# Patient Record
Sex: Female | Born: 1945 | Race: White | Hispanic: No | Smoking: Never smoker
Health system: Southern US, Community
[De-identification: ages and names within clinical notes are randomized; demographics above are authoritative.]

## PROBLEM LIST (undated history)

## (undated) DIAGNOSIS — C4491 Basal cell carcinoma of skin, unspecified: Secondary | ICD-10-CM

## (undated) DIAGNOSIS — N183 Chronic kidney disease, stage 3 unspecified: Secondary | ICD-10-CM

## (undated) DIAGNOSIS — T8859XA Other complications of anesthesia, initial encounter: Secondary | ICD-10-CM

## (undated) DIAGNOSIS — E785 Hyperlipidemia, unspecified: Secondary | ICD-10-CM

## (undated) DIAGNOSIS — S82899A Other fracture of unspecified lower leg, initial encounter for closed fracture: Secondary | ICD-10-CM

## (undated) DIAGNOSIS — T148XXA Other injury of unspecified body region, initial encounter: Secondary | ICD-10-CM

## (undated) DIAGNOSIS — I10 Essential (primary) hypertension: Secondary | ICD-10-CM

## (undated) DIAGNOSIS — R112 Nausea with vomiting, unspecified: Secondary | ICD-10-CM

## (undated) DIAGNOSIS — R011 Cardiac murmur, unspecified: Secondary | ICD-10-CM

## (undated) DIAGNOSIS — C4492 Squamous cell carcinoma of skin, unspecified: Secondary | ICD-10-CM

## (undated) HISTORY — PX: CATARACT EXTRACTION, BILATERAL: SHX1313

## (undated) HISTORY — PX: COLONOSCOPY: SHX174

## (undated) HISTORY — PX: COLON SURGERY: SHX602

## (undated) HISTORY — PX: ABDOMINAL HYSTERECTOMY: SHX81

---

## 2004-09-17 ENCOUNTER — Emergency Department (HOSPITAL_COMMUNITY): Admission: EM | Admit: 2004-09-17 | Discharge: 2004-09-17 | Payer: Self-pay | Admitting: Emergency Medicine

## 2004-09-22 ENCOUNTER — Ambulatory Visit: Payer: Self-pay | Admitting: Orthopedic Surgery

## 2006-09-12 IMAGING — CR DG KNEE COMPLETE 4+V*R*
4 series · 4 of 4 positions shown · non-contrast
Comparison: none

CLINICAL DATA: Fall with right ankle and right knee injuries.
 RIGHT ANKLE ? 3 VIEWS:
 Oblique and mildly displaced fracture is noted through the distal fibula extending into the joint space.  There is associated overlying soft tissue swelling.  No other fracture is seen.  There is associated joint effusion.  Alignment of the ankle mortis itself is intact.

[view not recorded (1 of 4)]
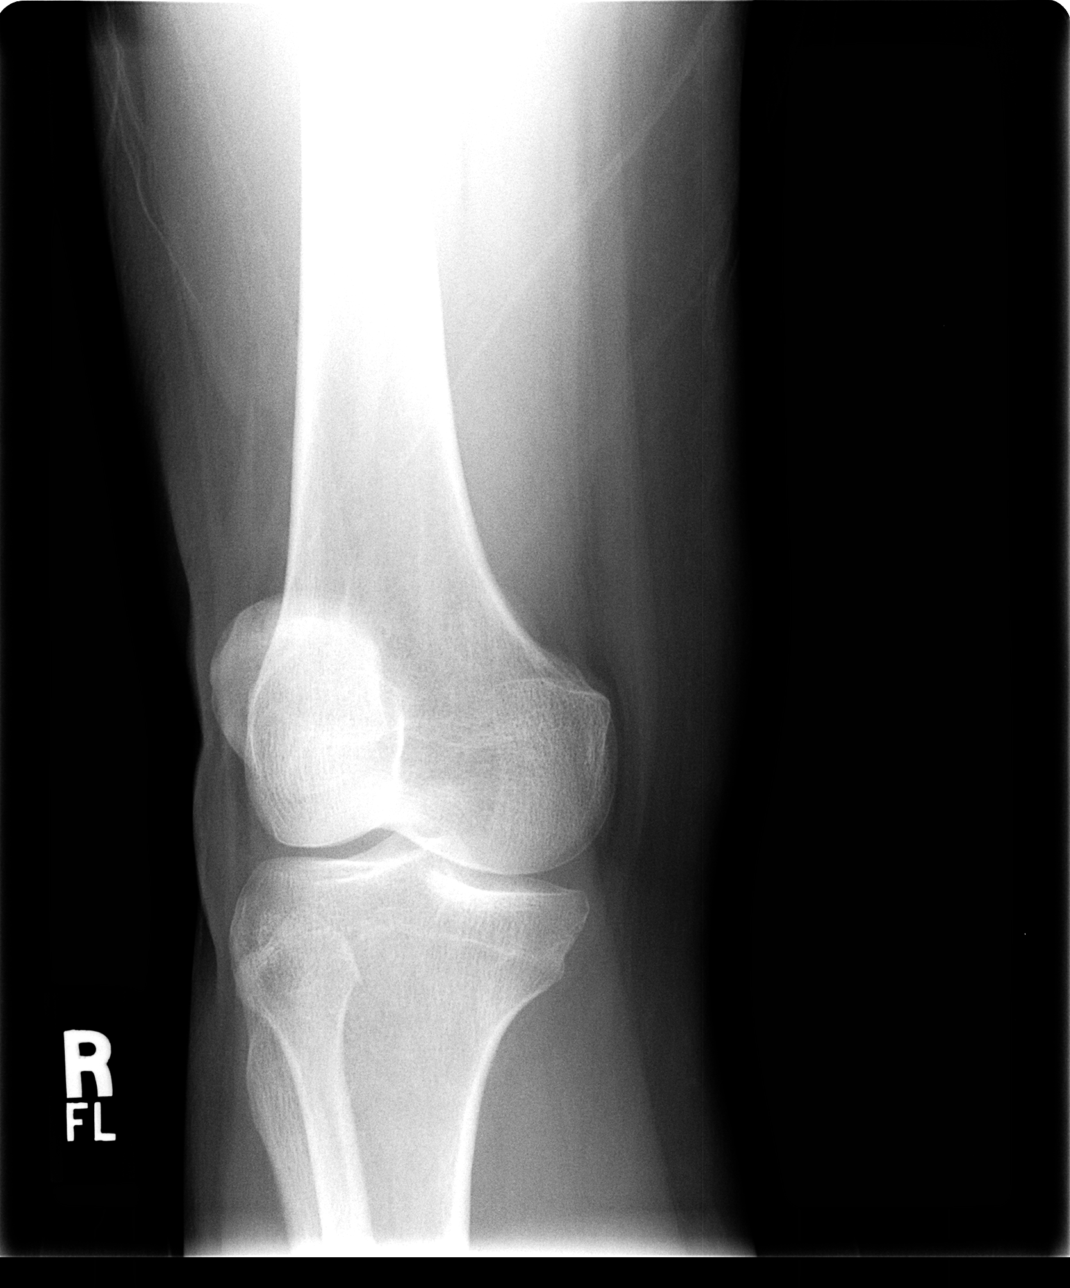

[view not recorded (2 of 4)]
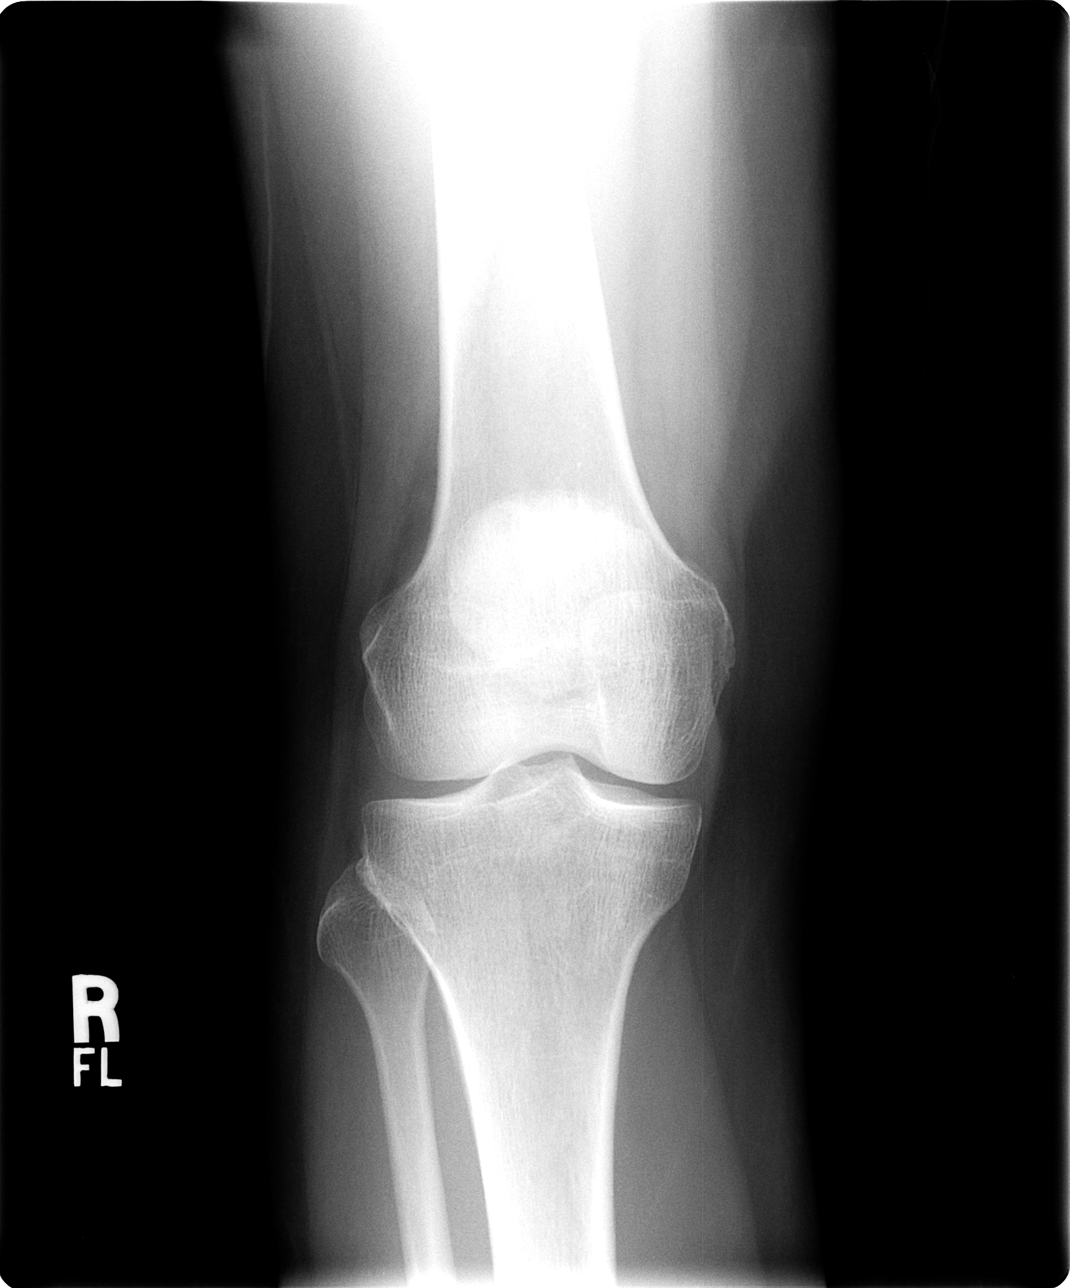

[view not recorded (3 of 4)]
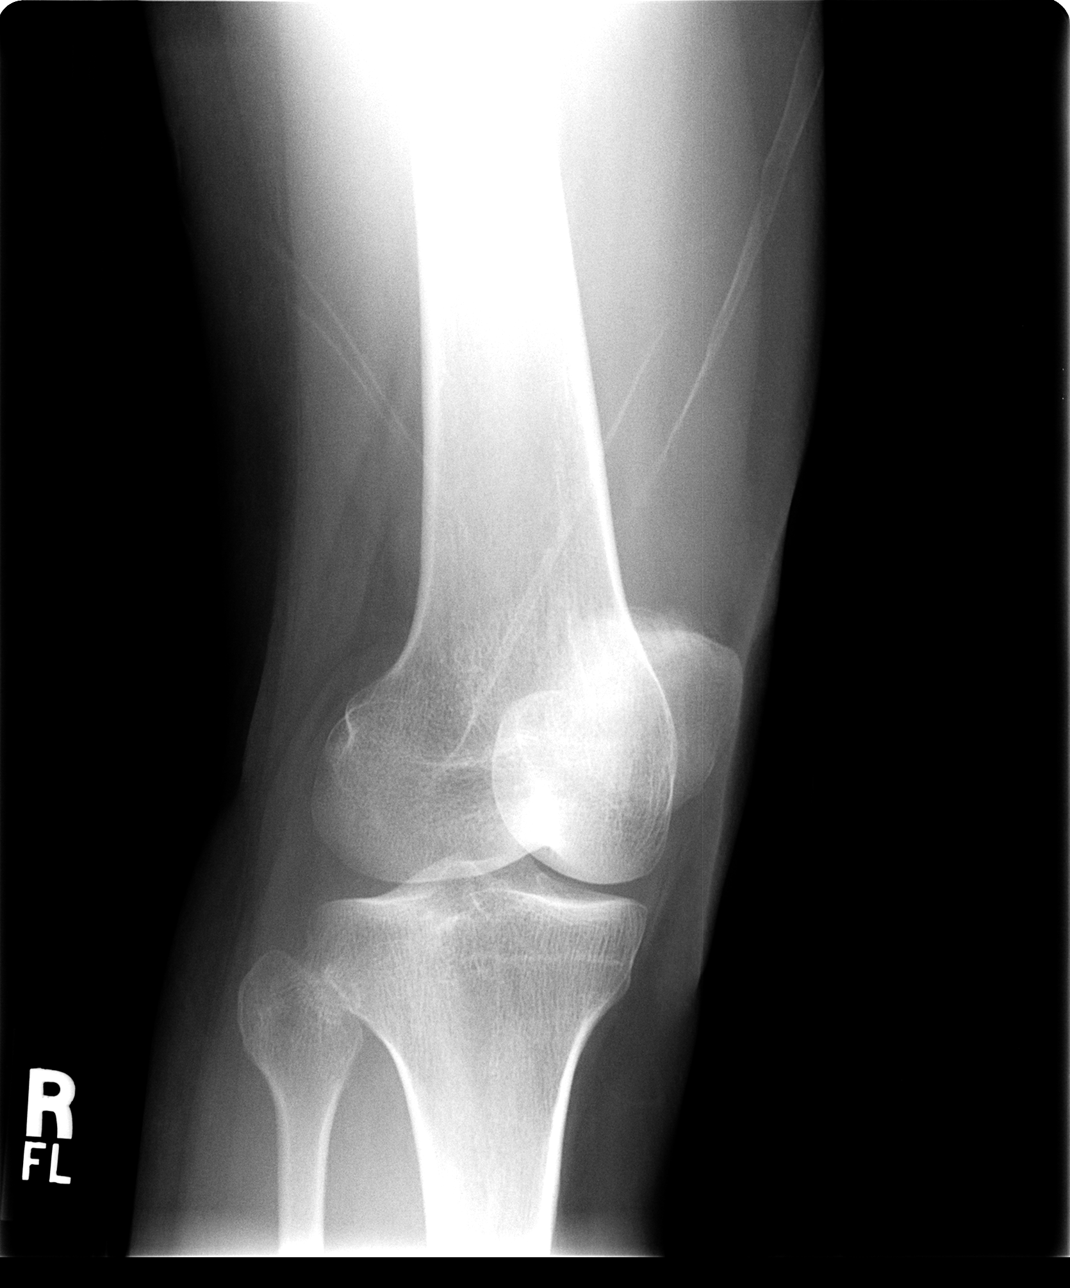

[view not recorded (4 of 4)]
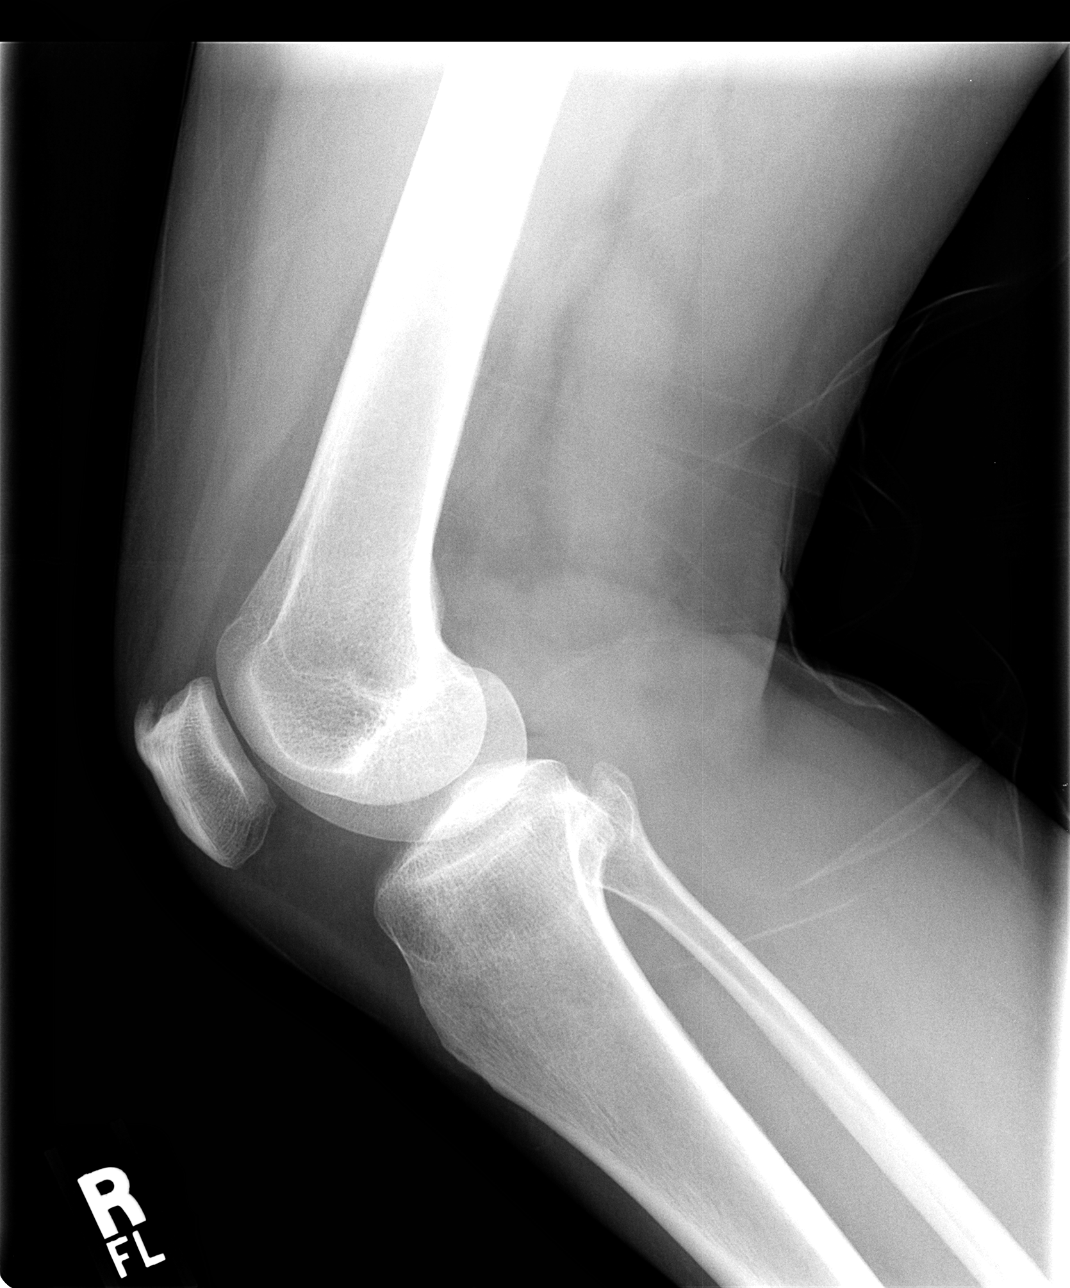

[4 of 4 positions shown; findings below may reference images not displayed]

IMPRESSION: Oblique fracture of lateral malleolus.
 RIGHT KNEE ? 4 VIEWS;
 No acute fracture or dislocation.  No joint effusion on the lateral film.  Superior patellar spur.
IMPRESSION: No acute abnormalities.

## 2006-09-12 IMAGING — CR DG ANKLE COMPLETE 3+V*R*
2 series · 2 of 2 positions shown · non-contrast
Comparison: none

CLINICAL DATA: Fall with right ankle and right knee injuries.
 RIGHT ANKLE ? 3 VIEWS:
 Oblique and mildly displaced fracture is noted through the distal fibula extending into the joint space.  There is associated overlying soft tissue swelling.  No other fracture is seen.  There is associated joint effusion.  Alignment of the ankle mortis itself is intact.

[view not recorded (1 of 2)]
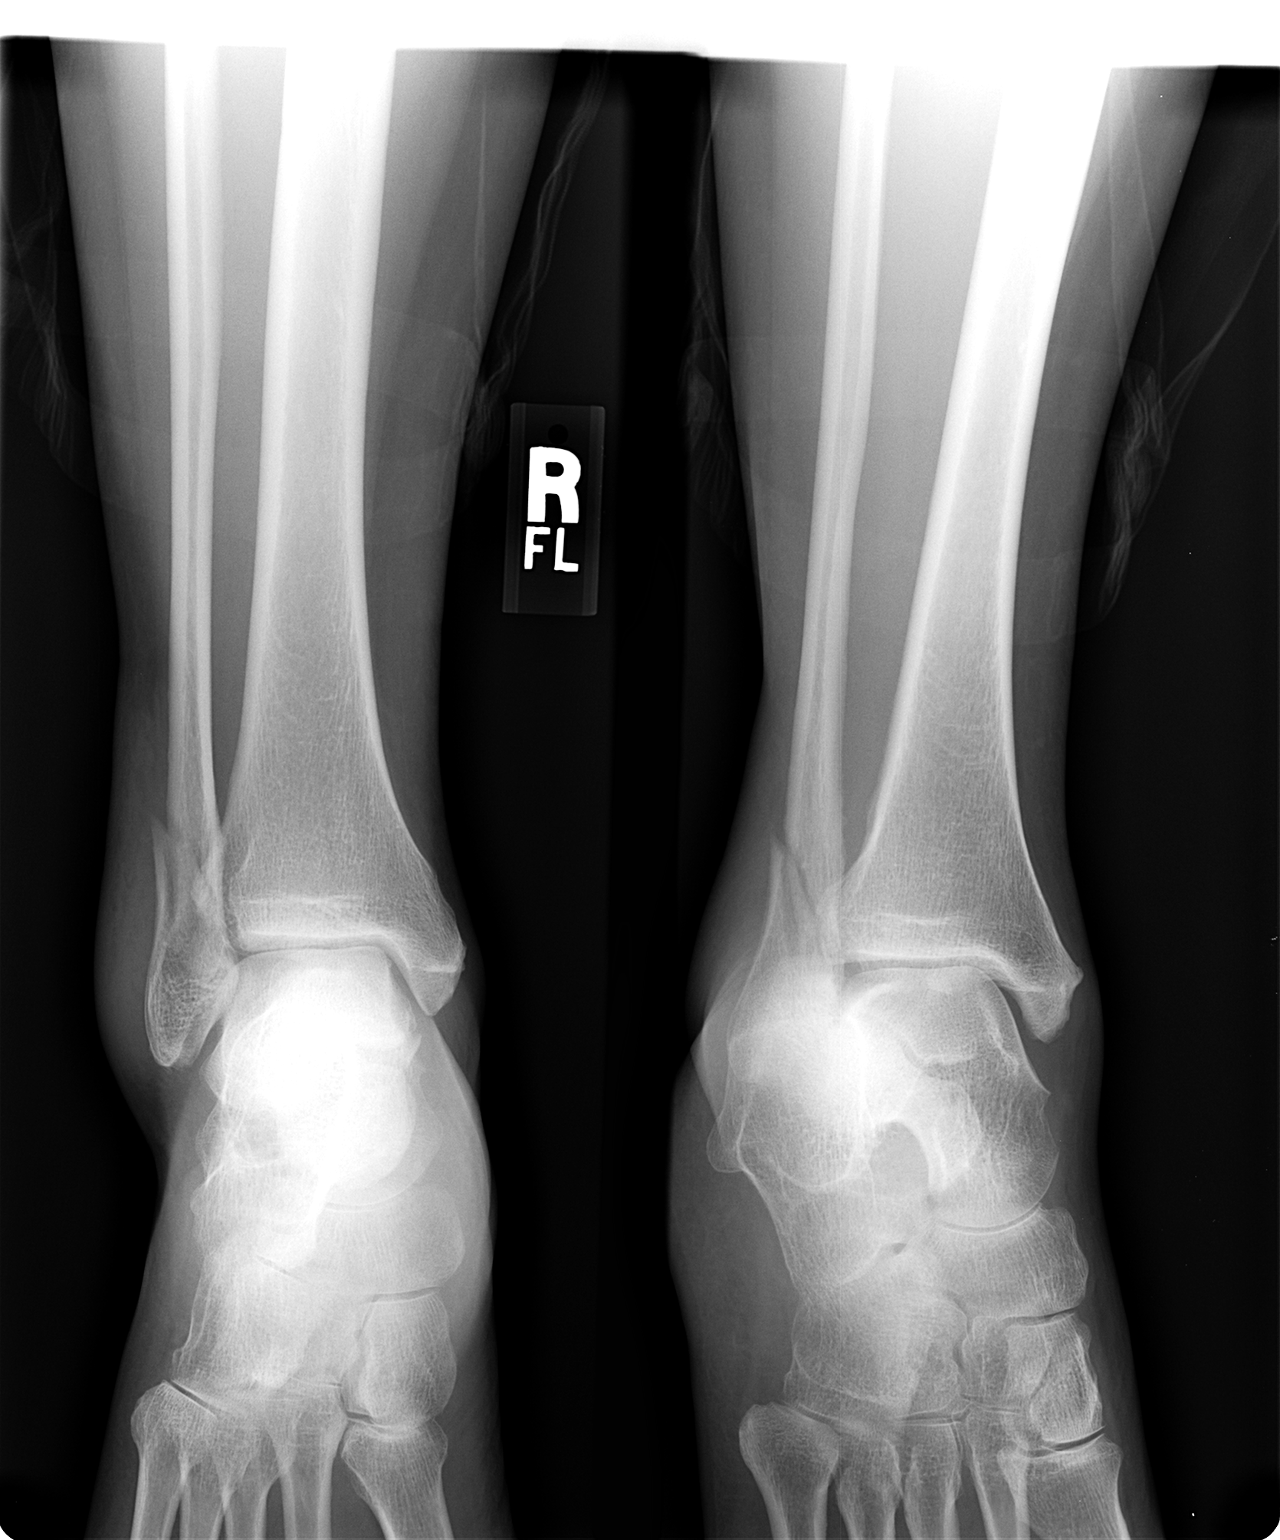

[view not recorded (2 of 2)]
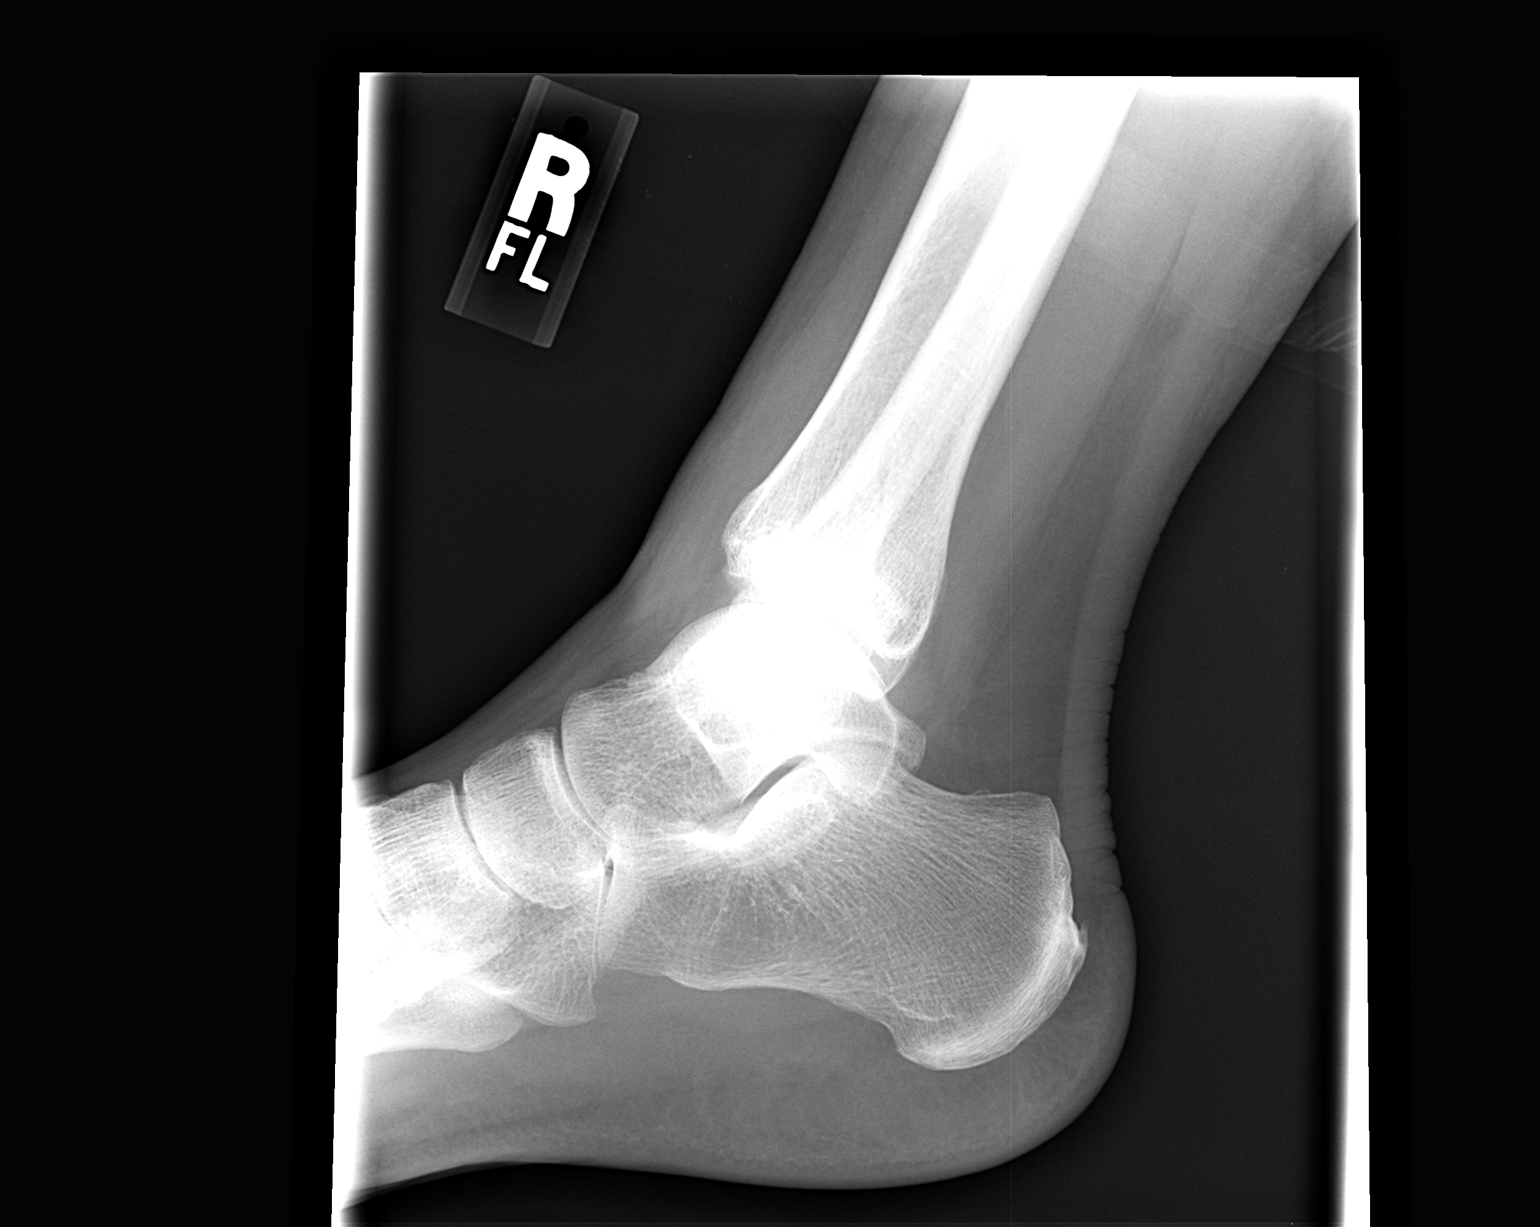

[2 of 2 positions shown; findings below may reference images not displayed]

IMPRESSION: Oblique fracture of lateral malleolus.
 RIGHT KNEE ? 4 VIEWS;
 No acute fracture or dislocation.  No joint effusion on the lateral film.  Superior patellar spur.
IMPRESSION: No acute abnormalities.

## 2015-06-28 ENCOUNTER — Ambulatory Visit (HOSPITAL_COMMUNITY)
Admission: RE | Admit: 2015-06-28 | Discharge: 2015-06-28 | Disposition: A | Discharge status: Home / Self Care | Payer: Medicare Other | Source: Ambulatory Visit | Attending: Ophthalmology | Admitting: Ophthalmology

## 2015-06-28 ENCOUNTER — Encounter (HOSPITAL_COMMUNITY)
Admission: RE | Disposition: A | Discharge status: Home / Self Care | Payer: Self-pay | Source: Ambulatory Visit | Attending: Ophthalmology

## 2015-06-28 ENCOUNTER — Encounter (HOSPITAL_COMMUNITY): Payer: Self-pay

## 2015-06-28 DIAGNOSIS — H2640 Unspecified secondary cataract: Secondary | ICD-10-CM | POA: Diagnosis present

## 2015-06-28 DIAGNOSIS — Z7982 Long term (current) use of aspirin: Secondary | ICD-10-CM | POA: Insufficient documentation

## 2015-06-28 HISTORY — DX: Essential (primary) hypertension: I10

## 2015-06-28 HISTORY — PX: YAG LASER APPLICATION: SHX6189

## 2015-06-28 HISTORY — DX: Hyperlipidemia, unspecified: E78.5

## 2015-06-28 SURGERY — TREATMENT, USING YAG LASER
Anesthesia: LOCAL | Laterality: Right

## 2015-06-28 MED ORDER — TETRACAINE HCL 0.5 % OP SOLN
1.0000 [drp] | Freq: Once | OPHTHALMIC | Status: AC
  Administered 2015-06-28: 1 [drp] via OPHTHALMIC

## 2015-06-28 MED ORDER — TETRACAINE HCL 0.5 % OP SOLN
OPHTHALMIC | Status: AC
  Filled 2015-06-28: qty 4

## 2015-06-28 MED ORDER — TROPICAMIDE 1 % OP SOLN
OPHTHALMIC | Status: AC
  Filled 2015-06-28: qty 3

## 2015-06-28 MED ORDER — TROPICAMIDE 1 % OP SOLN
1.0000 [drp] | Freq: Once | OPHTHALMIC | Status: AC
  Administered 2015-06-28: 1 [drp] via OPHTHALMIC

## 2015-06-28 NOTE — Discharge Instructions (Addendum)
Brenda Dorsey  06/28/2015     Instructions    Activity: No Restrictions.   Diet: Resume Diet you were on at home.   Pain Medication: Tylenol if Needed.   CONTACT YOUR DOCTOR IF YOU HAVE PAIN, REDNESS IN YOUR EYE, OR DECREASED VISION.   Follow-up:in 3 weeks with Susa Simmonds, MD.   Dr. Nile Riggs: [phone]  Dr. Lita Mains: [phone]  Dr. Alto Denver: [phone]   If you find that you cannot contact your physician, but feel that your signs and   Symptoms warrant a physician's attention, call the Emergency Room at   [phone] ext.[phone].   Othern/a.   Follow up appointment with Dr. Lita Mains at his office on 07/20/2015 @ 11:15 am

## 2015-06-28 NOTE — Brief Op Note (Signed)
Brenda Dorsey 06/28/2015  Susa Simmonds, MD  Pre-op Diagnosis:  secondary cataract right eye  Post-op Diagnosis:  same  Yag laser self-test completed: Yes.    Indications:  See scanned office H&P  Procedure:  YAG posterior capsulotomy OS  Eye protection worn by staff:  Yes.   Laser In Use sign on door:  Yes.    Laser:  {LUMENIS YAG/SLT LASER  selecta duet  Power Setting:  1.37 mJ/burst Anatomical site treated:  Posterior capsule OS Number of applications:  39 Total energy delivered: 64.89 mJ Results:  Significant capsular scarring successfully opened with resulting clear visual axis  Patient was instructed to go to the office, as previously scheduled, for intraocular pressure:  No.  Patient verbalizes understanding of discharge instructions:  Yes.    Notes:  Pt. Tolerated procedure well without complications

## 2015-06-30 ENCOUNTER — Encounter (HOSPITAL_COMMUNITY): Payer: Self-pay | Admitting: Ophthalmology

## 2019-05-13 DIAGNOSIS — I10 Essential (primary) hypertension: Secondary | ICD-10-CM | POA: Diagnosis not present

## 2019-05-13 DIAGNOSIS — F329 Major depressive disorder, single episode, unspecified: Secondary | ICD-10-CM | POA: Diagnosis not present

## 2019-06-11 DIAGNOSIS — F329 Major depressive disorder, single episode, unspecified: Secondary | ICD-10-CM | POA: Diagnosis not present

## 2019-06-11 DIAGNOSIS — I10 Essential (primary) hypertension: Secondary | ICD-10-CM | POA: Diagnosis not present

## 2019-07-08 DIAGNOSIS — F329 Major depressive disorder, single episode, unspecified: Secondary | ICD-10-CM | POA: Diagnosis not present

## 2019-07-08 DIAGNOSIS — I10 Essential (primary) hypertension: Secondary | ICD-10-CM | POA: Diagnosis not present

## 2019-08-20 DIAGNOSIS — I10 Essential (primary) hypertension: Secondary | ICD-10-CM | POA: Diagnosis not present

## 2019-08-20 DIAGNOSIS — F329 Major depressive disorder, single episode, unspecified: Secondary | ICD-10-CM | POA: Diagnosis not present

## 2019-08-28 DIAGNOSIS — H26492 Other secondary cataract, left eye: Secondary | ICD-10-CM | POA: Diagnosis not present

## 2019-08-28 DIAGNOSIS — Z961 Presence of intraocular lens: Secondary | ICD-10-CM | POA: Diagnosis not present

## 2019-09-23 DIAGNOSIS — Z85828 Personal history of other malignant neoplasm of skin: Secondary | ICD-10-CM | POA: Diagnosis not present

## 2019-09-23 DIAGNOSIS — L570 Actinic keratosis: Secondary | ICD-10-CM | POA: Diagnosis not present

## 2019-09-23 DIAGNOSIS — L02214 Cutaneous abscess of groin: Secondary | ICD-10-CM | POA: Diagnosis not present

## 2019-10-07 DIAGNOSIS — L02214 Cutaneous abscess of groin: Secondary | ICD-10-CM | POA: Diagnosis not present

## 2019-10-16 DIAGNOSIS — T1501XD Foreign body in cornea, right eye, subsequent encounter: Secondary | ICD-10-CM | POA: Diagnosis not present

## 2019-10-21 DIAGNOSIS — Z1339 Encounter for screening examination for other mental health and behavioral disorders: Secondary | ICD-10-CM | POA: Diagnosis not present

## 2019-10-21 DIAGNOSIS — Z6823 Body mass index (BMI) 23.0-23.9, adult: Secondary | ICD-10-CM | POA: Diagnosis not present

## 2019-10-21 DIAGNOSIS — E7800 Pure hypercholesterolemia, unspecified: Secondary | ICD-10-CM | POA: Diagnosis not present

## 2019-10-21 DIAGNOSIS — Z0000 Encounter for general adult medical examination without abnormal findings: Secondary | ICD-10-CM | POA: Diagnosis not present

## 2019-10-21 DIAGNOSIS — Z7189 Other specified counseling: Secondary | ICD-10-CM | POA: Diagnosis not present

## 2019-10-21 DIAGNOSIS — Z1211 Encounter for screening for malignant neoplasm of colon: Secondary | ICD-10-CM | POA: Diagnosis not present

## 2019-10-21 DIAGNOSIS — E559 Vitamin D deficiency, unspecified: Secondary | ICD-10-CM | POA: Diagnosis not present

## 2019-10-21 DIAGNOSIS — Z79899 Other long term (current) drug therapy: Secondary | ICD-10-CM | POA: Diagnosis not present

## 2019-10-21 DIAGNOSIS — R5383 Other fatigue: Secondary | ICD-10-CM | POA: Diagnosis not present

## 2019-10-21 DIAGNOSIS — Z299 Encounter for prophylactic measures, unspecified: Secondary | ICD-10-CM | POA: Diagnosis not present

## 2019-10-21 DIAGNOSIS — Z1331 Encounter for screening for depression: Secondary | ICD-10-CM | POA: Diagnosis not present

## 2019-11-17 DIAGNOSIS — E2839 Other primary ovarian failure: Secondary | ICD-10-CM | POA: Diagnosis not present

## 2020-04-19 DIAGNOSIS — N183 Chronic kidney disease, stage 3 unspecified: Secondary | ICD-10-CM | POA: Diagnosis not present

## 2020-04-19 DIAGNOSIS — I10 Essential (primary) hypertension: Secondary | ICD-10-CM | POA: Diagnosis not present

## 2020-04-19 DIAGNOSIS — Z299 Encounter for prophylactic measures, unspecified: Secondary | ICD-10-CM | POA: Diagnosis not present

## 2020-06-21 DIAGNOSIS — H1131 Conjunctival hemorrhage, right eye: Secondary | ICD-10-CM | POA: Diagnosis not present

## 2020-06-21 DIAGNOSIS — Z961 Presence of intraocular lens: Secondary | ICD-10-CM | POA: Diagnosis not present

## 2020-06-21 DIAGNOSIS — Z0100 Encounter for examination of eyes and vision without abnormal findings: Secondary | ICD-10-CM | POA: Diagnosis not present

## 2020-06-21 DIAGNOSIS — H524 Presbyopia: Secondary | ICD-10-CM | POA: Diagnosis not present

## 2020-10-21 DIAGNOSIS — Z1331 Encounter for screening for depression: Secondary | ICD-10-CM | POA: Diagnosis not present

## 2020-10-21 DIAGNOSIS — E559 Vitamin D deficiency, unspecified: Secondary | ICD-10-CM | POA: Diagnosis not present

## 2020-10-21 DIAGNOSIS — I10 Essential (primary) hypertension: Secondary | ICD-10-CM | POA: Diagnosis not present

## 2020-10-21 DIAGNOSIS — N183 Chronic kidney disease, stage 3 unspecified: Secondary | ICD-10-CM | POA: Diagnosis not present

## 2020-10-21 DIAGNOSIS — R5383 Other fatigue: Secondary | ICD-10-CM | POA: Diagnosis not present

## 2020-10-21 DIAGNOSIS — Z1339 Encounter for screening examination for other mental health and behavioral disorders: Secondary | ICD-10-CM | POA: Diagnosis not present

## 2020-10-21 DIAGNOSIS — Z7189 Other specified counseling: Secondary | ICD-10-CM | POA: Diagnosis not present

## 2020-10-21 DIAGNOSIS — E7800 Pure hypercholesterolemia, unspecified: Secondary | ICD-10-CM | POA: Diagnosis not present

## 2020-10-21 DIAGNOSIS — Z79899 Other long term (current) drug therapy: Secondary | ICD-10-CM | POA: Diagnosis not present

## 2020-10-21 DIAGNOSIS — Z6823 Body mass index (BMI) 23.0-23.9, adult: Secondary | ICD-10-CM | POA: Diagnosis not present

## 2020-10-21 DIAGNOSIS — Z299 Encounter for prophylactic measures, unspecified: Secondary | ICD-10-CM | POA: Diagnosis not present

## 2020-10-21 DIAGNOSIS — Z0000 Encounter for general adult medical examination without abnormal findings: Secondary | ICD-10-CM | POA: Diagnosis not present

## 2020-11-04 ENCOUNTER — Other Ambulatory Visit: Payer: Self-pay | Admitting: Internal Medicine

## 2020-11-04 DIAGNOSIS — Z139 Encounter for screening, unspecified: Secondary | ICD-10-CM

## 2020-11-05 ENCOUNTER — Ambulatory Visit
Admission: RE | Admit: 2020-11-05 | Discharge: 2020-11-05 | Disposition: A | Discharge status: Home / Self Care | Payer: Medicare HMO | Source: Ambulatory Visit | Attending: Internal Medicine | Admitting: Internal Medicine

## 2020-11-05 ENCOUNTER — Other Ambulatory Visit: Payer: Self-pay

## 2020-11-05 DIAGNOSIS — Z139 Encounter for screening, unspecified: Secondary | ICD-10-CM

## 2020-11-05 DIAGNOSIS — Z1231 Encounter for screening mammogram for malignant neoplasm of breast: Secondary | ICD-10-CM | POA: Diagnosis not present

## 2021-04-22 DIAGNOSIS — H6123 Impacted cerumen, bilateral: Secondary | ICD-10-CM | POA: Diagnosis not present

## 2021-04-22 DIAGNOSIS — F32A Depression, unspecified: Secondary | ICD-10-CM | POA: Diagnosis not present

## 2021-04-22 DIAGNOSIS — I10 Essential (primary) hypertension: Secondary | ICD-10-CM | POA: Diagnosis not present

## 2021-04-22 DIAGNOSIS — Z299 Encounter for prophylactic measures, unspecified: Secondary | ICD-10-CM | POA: Diagnosis not present

## 2021-04-22 DIAGNOSIS — Z789 Other specified health status: Secondary | ICD-10-CM | POA: Diagnosis not present

## 2021-07-20 DIAGNOSIS — E7800 Pure hypercholesterolemia, unspecified: Secondary | ICD-10-CM | POA: Diagnosis not present

## 2021-07-20 DIAGNOSIS — I10 Essential (primary) hypertension: Secondary | ICD-10-CM | POA: Diagnosis not present

## 2021-07-20 DIAGNOSIS — H524 Presbyopia: Secondary | ICD-10-CM | POA: Diagnosis not present

## 2021-07-20 DIAGNOSIS — Z0100 Encounter for examination of eyes and vision without abnormal findings: Secondary | ICD-10-CM | POA: Diagnosis not present

## 2021-10-20 DIAGNOSIS — Z299 Encounter for prophylactic measures, unspecified: Secondary | ICD-10-CM | POA: Diagnosis not present

## 2021-10-20 DIAGNOSIS — Z6824 Body mass index (BMI) 24.0-24.9, adult: Secondary | ICD-10-CM | POA: Diagnosis not present

## 2021-10-20 DIAGNOSIS — R5383 Other fatigue: Secondary | ICD-10-CM | POA: Diagnosis not present

## 2021-10-20 DIAGNOSIS — E559 Vitamin D deficiency, unspecified: Secondary | ICD-10-CM | POA: Diagnosis not present

## 2021-10-20 DIAGNOSIS — Z1331 Encounter for screening for depression: Secondary | ICD-10-CM | POA: Diagnosis not present

## 2021-10-20 DIAGNOSIS — Z1339 Encounter for screening examination for other mental health and behavioral disorders: Secondary | ICD-10-CM | POA: Diagnosis not present

## 2021-10-20 DIAGNOSIS — E7800 Pure hypercholesterolemia, unspecified: Secondary | ICD-10-CM | POA: Diagnosis not present

## 2021-10-20 DIAGNOSIS — Z0000 Encounter for general adult medical examination without abnormal findings: Secondary | ICD-10-CM | POA: Diagnosis not present

## 2021-10-20 DIAGNOSIS — I10 Essential (primary) hypertension: Secondary | ICD-10-CM | POA: Diagnosis not present

## 2021-10-20 DIAGNOSIS — Z79899 Other long term (current) drug therapy: Secondary | ICD-10-CM | POA: Diagnosis not present

## 2021-10-20 DIAGNOSIS — Z7189 Other specified counseling: Secondary | ICD-10-CM | POA: Diagnosis not present

## 2021-10-28 DIAGNOSIS — Z299 Encounter for prophylactic measures, unspecified: Secondary | ICD-10-CM | POA: Diagnosis not present

## 2021-10-28 DIAGNOSIS — H6122 Impacted cerumen, left ear: Secondary | ICD-10-CM | POA: Diagnosis not present

## 2021-10-28 DIAGNOSIS — I10 Essential (primary) hypertension: Secondary | ICD-10-CM | POA: Diagnosis not present

## 2021-11-21 DIAGNOSIS — E2839 Other primary ovarian failure: Secondary | ICD-10-CM | POA: Diagnosis not present

## 2022-02-20 DIAGNOSIS — Z23 Encounter for immunization: Secondary | ICD-10-CM | POA: Diagnosis not present

## 2022-02-20 DIAGNOSIS — Z0000 Encounter for general adult medical examination without abnormal findings: Secondary | ICD-10-CM | POA: Diagnosis not present

## 2022-02-20 DIAGNOSIS — I10 Essential (primary) hypertension: Secondary | ICD-10-CM | POA: Diagnosis not present

## 2022-02-20 DIAGNOSIS — Z299 Encounter for prophylactic measures, unspecified: Secondary | ICD-10-CM | POA: Diagnosis not present

## 2022-02-20 DIAGNOSIS — F32A Depression, unspecified: Secondary | ICD-10-CM | POA: Diagnosis not present

## 2022-02-20 DIAGNOSIS — R21 Rash and other nonspecific skin eruption: Secondary | ICD-10-CM | POA: Diagnosis not present

## 2022-02-20 DIAGNOSIS — Z6824 Body mass index (BMI) 24.0-24.9, adult: Secondary | ICD-10-CM | POA: Diagnosis not present

## 2022-05-31 DIAGNOSIS — Z299 Encounter for prophylactic measures, unspecified: Secondary | ICD-10-CM | POA: Diagnosis not present

## 2022-05-31 DIAGNOSIS — I10 Essential (primary) hypertension: Secondary | ICD-10-CM | POA: Diagnosis not present

## 2022-05-31 DIAGNOSIS — N183 Chronic kidney disease, stage 3 unspecified: Secondary | ICD-10-CM | POA: Diagnosis not present

## 2022-09-13 DIAGNOSIS — H524 Presbyopia: Secondary | ICD-10-CM | POA: Diagnosis not present

## 2022-10-09 DIAGNOSIS — I10 Essential (primary) hypertension: Secondary | ICD-10-CM | POA: Diagnosis not present

## 2022-10-09 DIAGNOSIS — Z0000 Encounter for general adult medical examination without abnormal findings: Secondary | ICD-10-CM | POA: Diagnosis not present

## 2022-10-09 DIAGNOSIS — N183 Chronic kidney disease, stage 3 unspecified: Secondary | ICD-10-CM | POA: Diagnosis not present

## 2022-10-09 DIAGNOSIS — Z299 Encounter for prophylactic measures, unspecified: Secondary | ICD-10-CM | POA: Diagnosis not present

## 2022-10-09 DIAGNOSIS — H6123 Impacted cerumen, bilateral: Secondary | ICD-10-CM | POA: Diagnosis not present

## 2023-02-26 DIAGNOSIS — Z23 Encounter for immunization: Secondary | ICD-10-CM | POA: Diagnosis not present

## 2023-02-26 DIAGNOSIS — R5383 Other fatigue: Secondary | ICD-10-CM | POA: Diagnosis not present

## 2023-02-26 DIAGNOSIS — Z7189 Other specified counseling: Secondary | ICD-10-CM | POA: Diagnosis not present

## 2023-02-26 DIAGNOSIS — Z79899 Other long term (current) drug therapy: Secondary | ICD-10-CM | POA: Diagnosis not present

## 2023-02-26 DIAGNOSIS — I10 Essential (primary) hypertension: Secondary | ICD-10-CM | POA: Diagnosis not present

## 2023-02-26 DIAGNOSIS — Z1331 Encounter for screening for depression: Secondary | ICD-10-CM | POA: Diagnosis not present

## 2023-02-26 DIAGNOSIS — E7800 Pure hypercholesterolemia, unspecified: Secondary | ICD-10-CM | POA: Diagnosis not present

## 2023-02-26 DIAGNOSIS — Z299 Encounter for prophylactic measures, unspecified: Secondary | ICD-10-CM | POA: Diagnosis not present

## 2023-02-26 DIAGNOSIS — Z0000 Encounter for general adult medical examination without abnormal findings: Secondary | ICD-10-CM | POA: Diagnosis not present

## 2023-02-26 DIAGNOSIS — Z1339 Encounter for screening examination for other mental health and behavioral disorders: Secondary | ICD-10-CM | POA: Diagnosis not present

## 2023-02-27 DIAGNOSIS — E559 Vitamin D deficiency, unspecified: Secondary | ICD-10-CM | POA: Diagnosis not present

## 2023-02-27 DIAGNOSIS — Z79899 Other long term (current) drug therapy: Secondary | ICD-10-CM | POA: Diagnosis not present

## 2023-02-27 DIAGNOSIS — E7800 Pure hypercholesterolemia, unspecified: Secondary | ICD-10-CM | POA: Diagnosis not present

## 2023-02-27 DIAGNOSIS — R5383 Other fatigue: Secondary | ICD-10-CM | POA: Diagnosis not present

## 2023-06-15 DIAGNOSIS — N183 Chronic kidney disease, stage 3 unspecified: Secondary | ICD-10-CM | POA: Diagnosis not present

## 2023-06-15 DIAGNOSIS — Z299 Encounter for prophylactic measures, unspecified: Secondary | ICD-10-CM | POA: Diagnosis not present

## 2023-06-15 DIAGNOSIS — I10 Essential (primary) hypertension: Secondary | ICD-10-CM | POA: Diagnosis not present

## 2023-08-27 DIAGNOSIS — Z0000 Encounter for general adult medical examination without abnormal findings: Secondary | ICD-10-CM | POA: Diagnosis not present

## 2023-08-27 DIAGNOSIS — N183 Chronic kidney disease, stage 3 unspecified: Secondary | ICD-10-CM | POA: Diagnosis not present

## 2023-08-27 DIAGNOSIS — I10 Essential (primary) hypertension: Secondary | ICD-10-CM | POA: Diagnosis not present

## 2023-08-27 DIAGNOSIS — Z299 Encounter for prophylactic measures, unspecified: Secondary | ICD-10-CM | POA: Diagnosis not present

## 2023-08-27 DIAGNOSIS — Z6823 Body mass index (BMI) 23.0-23.9, adult: Secondary | ICD-10-CM | POA: Diagnosis not present

## 2023-09-18 DIAGNOSIS — L119 Acantholytic disorder, unspecified: Secondary | ICD-10-CM | POA: Diagnosis not present

## 2023-09-18 DIAGNOSIS — L570 Actinic keratosis: Secondary | ICD-10-CM | POA: Diagnosis not present

## 2023-09-18 DIAGNOSIS — L820 Inflamed seborrheic keratosis: Secondary | ICD-10-CM | POA: Diagnosis not present

## 2023-09-18 DIAGNOSIS — D485 Neoplasm of uncertain behavior of skin: Secondary | ICD-10-CM | POA: Diagnosis not present

## 2024-02-13 DIAGNOSIS — H6123 Impacted cerumen, bilateral: Secondary | ICD-10-CM | POA: Diagnosis not present

## 2024-02-13 DIAGNOSIS — R52 Pain, unspecified: Secondary | ICD-10-CM | POA: Diagnosis not present

## 2024-02-13 DIAGNOSIS — I10 Essential (primary) hypertension: Secondary | ICD-10-CM | POA: Diagnosis not present

## 2024-02-13 DIAGNOSIS — Z299 Encounter for prophylactic measures, unspecified: Secondary | ICD-10-CM | POA: Diagnosis not present

## 2024-04-28 DIAGNOSIS — S42201A Unspecified fracture of upper end of right humerus, initial encounter for closed fracture: Secondary | ICD-10-CM

## 2024-04-28 HISTORY — DX: Unspecified fracture of upper end of right humerus, initial encounter for closed fracture: S42.201A

## 2024-04-30 ENCOUNTER — Other Ambulatory Visit: Payer: Self-pay

## 2024-04-30 ENCOUNTER — Encounter (HOSPITAL_COMMUNITY): Payer: Self-pay | Admitting: Orthopedic Surgery

## 2024-04-30 NOTE — Progress Notes (Signed)
 For Anesthesia: PCP - [facility] Internal Medicine Dr. Maree Cardiologist - N/A  Bowel Prep reminder: N/A  Chest x-ray - 04/28/24 CE EKG - greater than 1 year  Stress Test - N/A ECHO - N/A Cardiac Cath - N/A Pacemaker/ICD device last checked: N/A Pacemaker orders received: N/A Device Rep notified: N/A  Spinal Cord Stimulator: N/A  Sleep Study - N/A CPAP - N/A  Fasting Blood Sugar - N/A Checks Blood Sugar __N/A___ times a day Date and result of last Hgb A1c-N/A  Last dose of GLP1 agonist- N/A GLP1 instructions: Hold 7 days prior to schedule (Hold 24 hours-daily)   Last dose of SGLT-2 inhibitors- N/A SGLT-2 instructions: Hold 72 hours prior to surgery  Blood Thinner Instructions: N/A Last Dose: N/A Time last taken: N/A  Aspirin Instructions: Last Dose: 04/27/24 Time last taken: 11 PM  Activity level: Can go up a flight of stairs and activities of daily living without stopping and without chest pain and/or shortness of breath  Anesthesia review:   Patient denies shortness of breath, fever, cough and chest pain at PAT appointment   Patient verbalized understanding of instructions that were reviewed over the telephone.

## 2024-05-02 ENCOUNTER — Ambulatory Visit (HOSPITAL_COMMUNITY)
Admission: RE | Admit: 2024-05-02 | Discharge: 2024-05-02 | Disposition: A | Discharge status: Home / Self Care | Attending: Orthopedic Surgery | Admitting: Orthopedic Surgery

## 2024-05-02 ENCOUNTER — Ambulatory Visit (HOSPITAL_COMMUNITY): Admitting: Anesthesiology

## 2024-05-02 ENCOUNTER — Encounter (HOSPITAL_COMMUNITY): Admission: RE | Disposition: A | Payer: Self-pay | Source: Home / Self Care | Attending: Orthopedic Surgery

## 2024-05-02 ENCOUNTER — Encounter (HOSPITAL_COMMUNITY): Payer: Self-pay | Admitting: Orthopedic Surgery

## 2024-05-02 ENCOUNTER — Other Ambulatory Visit: Payer: Self-pay

## 2024-05-02 DIAGNOSIS — Z7982 Long term (current) use of aspirin: Secondary | ICD-10-CM | POA: Insufficient documentation

## 2024-05-02 DIAGNOSIS — Z01818 Encounter for other preprocedural examination: Secondary | ICD-10-CM

## 2024-05-02 DIAGNOSIS — Z79899 Other long term (current) drug therapy: Secondary | ICD-10-CM | POA: Diagnosis not present

## 2024-05-02 DIAGNOSIS — E785 Hyperlipidemia, unspecified: Secondary | ICD-10-CM | POA: Insufficient documentation

## 2024-05-02 DIAGNOSIS — R2681 Unsteadiness on feet: Secondary | ICD-10-CM | POA: Insufficient documentation

## 2024-05-02 DIAGNOSIS — S0993XA Unspecified injury of face, initial encounter: Secondary | ICD-10-CM | POA: Insufficient documentation

## 2024-05-02 DIAGNOSIS — W06XXXA Fall from bed, initial encounter: Secondary | ICD-10-CM | POA: Insufficient documentation

## 2024-05-02 DIAGNOSIS — S42291A Other displaced fracture of upper end of right humerus, initial encounter for closed fracture: Secondary | ICD-10-CM | POA: Diagnosis not present

## 2024-05-02 DIAGNOSIS — I129 Hypertensive chronic kidney disease with stage 1 through stage 4 chronic kidney disease, or unspecified chronic kidney disease: Secondary | ICD-10-CM | POA: Diagnosis not present

## 2024-05-02 DIAGNOSIS — S42241A 4-part fracture of surgical neck of right humerus, initial encounter for closed fracture: Secondary | ICD-10-CM | POA: Insufficient documentation

## 2024-05-02 DIAGNOSIS — N183 Chronic kidney disease, stage 3 unspecified: Secondary | ICD-10-CM | POA: Diagnosis not present

## 2024-05-02 DIAGNOSIS — I10 Essential (primary) hypertension: Secondary | ICD-10-CM | POA: Insufficient documentation

## 2024-05-02 HISTORY — PX: REVERSE SHOULDER ARTHROPLASTY: SHX5054

## 2024-05-02 HISTORY — DX: Other fracture of unspecified lower leg, initial encounter for closed fracture: S82.899A

## 2024-05-02 HISTORY — DX: Chronic kidney disease, stage 3 unspecified: N18.30

## 2024-05-02 HISTORY — DX: Nausea with vomiting, unspecified: R11.2

## 2024-05-02 HISTORY — DX: Cardiac murmur, unspecified: R01.1

## 2024-05-02 HISTORY — DX: Basal cell carcinoma of skin, unspecified: C44.91

## 2024-05-02 HISTORY — DX: Other injury of unspecified body region, initial encounter: T14.8XXA

## 2024-05-02 HISTORY — DX: Squamous cell carcinoma of skin, unspecified: C44.92

## 2024-05-02 HISTORY — DX: Other complications of anesthesia, initial encounter: T88.59XA

## 2024-05-02 SURGERY — ARTHROPLASTY, SHOULDER, TOTAL, REVERSE
Anesthesia: Regional | Site: Shoulder | Laterality: Right

## 2024-05-02 MED ORDER — VANCOMYCIN HCL 1000 MG IV SOLR
INTRAVENOUS | Status: AC
  Filled 2024-05-02: qty 20

## 2024-05-02 MED ORDER — ONDANSETRON HCL 4 MG/2ML IJ SOLN
INTRAMUSCULAR | Status: DC | PRN
  Administered 2024-05-02: 4 mg via INTRAVENOUS

## 2024-05-02 MED ORDER — TRANEXAMIC ACID 1000 MG/10ML IV SOLN: 1000.0000 mg | INTRAVENOUS | Status: DC

## 2024-05-02 MED ORDER — DEXAMETHASONE SOD PHOSPHATE PF 10 MG/ML IJ SOLN
INTRAMUSCULAR | Status: DC | PRN
  Administered 2024-05-02: 4 mg via INTRAVENOUS

## 2024-05-02 MED ORDER — EPHEDRINE 5 MG/ML INJ
INTRAVENOUS | Status: AC
  Filled 2024-05-02: qty 5

## 2024-05-02 MED ORDER — SUGAMMADEX SODIUM 200 MG/2ML IV SOLN
INTRAVENOUS | Status: DC | PRN
  Administered 2024-05-02: 150 mg via INTRAVENOUS

## 2024-05-02 MED ORDER — PHENYLEPHRINE 80 MCG/ML (10ML) SYRINGE FOR IV PUSH (FOR BLOOD PRESSURE SUPPORT)
PREFILLED_SYRINGE | INTRAVENOUS | Status: DC | PRN
  Administered 2024-05-02: 140 ug via INTRAVENOUS
  Administered 2024-05-02: 80 ug via INTRAVENOUS
  Administered 2024-05-02: 180 ug via INTRAVENOUS
  Administered 2024-05-02 (×2): 160 ug via INTRAVENOUS
  Administered 2024-05-02: 80 ug via INTRAVENOUS

## 2024-05-02 MED ORDER — FENTANYL CITRATE (PF) 50 MCG/ML IJ SOSY
PREFILLED_SYRINGE | INTRAMUSCULAR | Status: AC
  Administered 2024-05-02: 50 ug via INTRAVENOUS
  Filled 2024-05-02: qty 2

## 2024-05-02 MED ORDER — DROPERIDOL 2.5 MG/ML IJ SOLN: 0.6250 mg | Freq: Once | INTRAMUSCULAR | Status: DC | PRN

## 2024-05-02 MED ORDER — LIDOCAINE 2% (20 MG/ML) 5 ML SYRINGE
INTRAMUSCULAR | Status: DC | PRN
  Administered 2024-05-02: 40 mg via INTRAVENOUS

## 2024-05-02 MED ORDER — OXYCODONE HCL 5 MG PO TABS: 5.0000 mg | ORAL_TABLET | Freq: Once | ORAL | Status: DC | PRN

## 2024-05-02 MED ORDER — FENTANYL CITRATE (PF) 50 MCG/ML IJ SOSY: 50.0000 ug | PREFILLED_SYRINGE | Freq: Once | INTRAMUSCULAR | Status: AC

## 2024-05-02 MED ORDER — BUPIVACAINE HCL (PF) 0.5 % IJ SOLN
INTRAMUSCULAR | Status: DC | PRN
  Administered 2024-05-02: 10 mL via PERINEURAL

## 2024-05-02 MED ORDER — PHENYLEPHRINE HCL-NACL 20-0.9 MG/250ML-% IV SOLN
INTRAVENOUS | Status: DC | PRN
  Administered 2024-05-02: 75 ug/min via INTRAVENOUS

## 2024-05-02 MED ORDER — ROCURONIUM BROMIDE 10 MG/ML (PF) SYRINGE
PREFILLED_SYRINGE | INTRAVENOUS | Status: DC | PRN
  Administered 2024-05-02: 40 mg via INTRAVENOUS
  Administered 2024-05-02: 10 mg via INTRAVENOUS

## 2024-05-02 MED ORDER — CEFAZOLIN SODIUM-DEXTROSE 2-4 GM/100ML-% IV SOLN
2.0000 g | INTRAVENOUS | Status: AC
  Administered 2024-05-02: 2 g via INTRAVENOUS
  Filled 2024-05-02: qty 100

## 2024-05-02 MED ORDER — PHENYLEPHRINE 80 MCG/ML (10ML) SYRINGE FOR IV PUSH (FOR BLOOD PRESSURE SUPPORT)
PREFILLED_SYRINGE | INTRAVENOUS | Status: AC
  Filled 2024-05-02: qty 10

## 2024-05-02 MED ORDER — METOCLOPRAMIDE HCL 5 MG/ML IJ SOLN: 5.0000 mg | Freq: Three times a day (TID) | INTRAMUSCULAR | Status: DC | PRN

## 2024-05-02 MED ORDER — LACTATED RINGERS IV SOLN: INTRAVENOUS | Status: DC

## 2024-05-02 MED ORDER — STERILE WATER FOR IRRIGATION IR SOLN
Status: DC | PRN
  Administered 2024-05-02: 2000 mL

## 2024-05-02 MED ORDER — EPHEDRINE SULFATE-NACL 50-0.9 MG/10ML-% IV SOSY
PREFILLED_SYRINGE | INTRAVENOUS | Status: DC | PRN
  Administered 2024-05-02 (×5): 5 mg via INTRAVENOUS

## 2024-05-02 MED ORDER — 0.9 % SODIUM CHLORIDE (POUR BTL) OPTIME
TOPICAL | Status: DC | PRN
  Administered 2024-05-02: 1000 mL

## 2024-05-02 MED ORDER — METOCLOPRAMIDE HCL 5 MG PO TABS: 5.0000 mg | ORAL_TABLET | Freq: Three times a day (TID) | ORAL | Status: DC | PRN

## 2024-05-02 MED ORDER — BUPIVACAINE LIPOSOME 1.3 % IJ SUSP
INTRAMUSCULAR | Status: DC | PRN
  Administered 2024-05-02: 10 mL

## 2024-05-02 MED ORDER — ONDANSETRON HCL 4 MG/2ML IJ SOLN: 4.0000 mg | Freq: Four times a day (QID) | INTRAMUSCULAR | Status: DC | PRN

## 2024-05-02 MED ORDER — FENTANYL CITRATE (PF) 50 MCG/ML IJ SOSY: 25.0000 ug | PREFILLED_SYRINGE | INTRAMUSCULAR | Status: DC | PRN

## 2024-05-02 MED ORDER — ACETAMINOPHEN 10 MG/ML IV SOLN: 1000.0000 mg | Freq: Once | INTRAVENOUS | Status: DC | PRN

## 2024-05-02 MED ORDER — VANCOMYCIN HCL 1000 MG IV SOLR
INTRAVENOUS | Status: DC | PRN
  Administered 2024-05-02: 1000 mg

## 2024-05-02 MED ORDER — LACTATED RINGERS IV BOLUS
250.0000 mL | Freq: Once | INTRAVENOUS | Status: AC
  Administered 2024-05-02: 250 mL via INTRAVENOUS

## 2024-05-02 MED ORDER — SUGAMMADEX SODIUM 200 MG/2ML IV SOLN
INTRAVENOUS | Status: AC
  Filled 2024-05-02: qty 2

## 2024-05-02 MED ORDER — TRANEXAMIC ACID-NACL 1000-0.7 MG/100ML-% IV SOLN
1000.0000 mg | INTRAVENOUS | Status: AC
  Administered 2024-05-02: 1000 mg via INTRAVENOUS
  Filled 2024-05-02: qty 100

## 2024-05-02 MED ORDER — OXYCODONE HCL 5 MG/5ML PO SOLN: 5.0000 mg | Freq: Once | ORAL | Status: DC | PRN

## 2024-05-02 MED ORDER — CHLORHEXIDINE GLUCONATE 0.12 % MT SOLN
15.0000 mL | Freq: Once | OROMUCOSAL | Status: AC
  Administered 2024-05-02: 15 mL via OROMUCOSAL

## 2024-05-02 MED ORDER — ORAL CARE MOUTH RINSE: 15.0000 mL | Freq: Once | OROMUCOSAL | Status: AC

## 2024-05-02 MED ORDER — ONDANSETRON HCL 4 MG PO TABS: 4.0000 mg | ORAL_TABLET | Freq: Four times a day (QID) | ORAL | Status: DC | PRN

## 2024-05-02 MED ORDER — PROPOFOL 10 MG/ML IV BOLUS
INTRAVENOUS | Status: DC | PRN
  Administered 2024-05-02: 100 mg via INTRAVENOUS

## 2024-05-02 MED ORDER — OXYCODONE-ACETAMINOPHEN 5-325 MG PO TABS: 1.0000 | ORAL_TABLET | Freq: Four times a day (QID) | ORAL | 0 refills | Status: AC | PRN

## 2024-05-02 SURGICAL SUPPLY — 56 items
BAG COUNTER SPONGE SURGICOUNT (BAG) IMPLANT
BAG ZIPLOCK 12X15 (MISCELLANEOUS) ×1 IMPLANT
BLADE SAW SGTL 83.5X18.5 (BLADE) ×1 IMPLANT
BNDG COHESIVE 4X5 TAN STRL LF (GAUZE/BANDAGES/DRESSINGS) ×1 IMPLANT
COOLER ICEMAN CLASSIC (MISCELLANEOUS) ×1 IMPLANT
COVER BACK TABLE 60X90IN (DRAPES) ×1 IMPLANT
COVER SURGICAL LIGHT HANDLE (MISCELLANEOUS) ×1 IMPLANT
CUP SUT UNIV REV NEUTRAL 33 (Cup) IMPLANT
DRAPE SHEET LG 3/4 BI-LAMINATE (DRAPES) ×1 IMPLANT
DRAPE SURG 17X11 SM STRL (DRAPES) ×1 IMPLANT
DRAPE SURG ORHT 6 SPLT 77X108 (DRAPES) ×2 IMPLANT
DRAPE TOP 10253 STERILE (DRAPES) ×1 IMPLANT
DRAPE U-SHAPE 47X51 STRL (DRAPES) ×1 IMPLANT
DRESSING AQUACEL AG SP 3.5X6 (GAUZE/BANDAGES/DRESSINGS) ×1 IMPLANT
DRSG AQUACEL AG ADV 3.5X 6 (GAUZE/BANDAGES/DRESSINGS) IMPLANT
DURAPREP 26ML APPLICATOR (WOUND CARE) ×1 IMPLANT
ELECT BLADE TIP CTD 4 INCH (ELECTRODE) ×1 IMPLANT
ELECT PENCIL ROCKER SW 15FT (MISCELLANEOUS) ×1 IMPLANT
ELECT REM PT RETURN 15FT ADLT (MISCELLANEOUS) ×1 IMPLANT
FACESHIELD WRAPAROUND OR TEAM (MASK) ×5 IMPLANT
GLENOID SYS 33/24 BASEPLATE (Miscellaneous) IMPLANT
GLENOID UNI REV MOD 24 +2 LAT (Joint) IMPLANT
GLOVE BIO SURGEON STRL SZ7.5 (GLOVE) ×1 IMPLANT
GLOVE BIO SURGEON STRL SZ8 (GLOVE) ×1 IMPLANT
GLOVE SS BIOGEL STRL SZ 7 (GLOVE) ×1 IMPLANT
GLOVE SS BIOGEL STRL SZ 7.5 (GLOVE) ×1 IMPLANT
GOWN SPEC L4 XLG W/TWL (GOWN DISPOSABLE) ×1 IMPLANT
GOWN STRL SURGICAL XL XLNG (GOWN DISPOSABLE) ×1 IMPLANT
KIT BASIN OR (CUSTOM PROCEDURE TRAY) ×1 IMPLANT
KIT TURNOVER KIT A (KITS) ×1 IMPLANT
LINER GLENOID HUM REV 33 +3 (Liner) IMPLANT
MANIFOLD NEPTUNE II (INSTRUMENTS) ×1 IMPLANT
NEEDLE TAPERED W/ NITINOL LOOP (MISCELLANEOUS) ×1 IMPLANT
NS IRRIG 1000ML POUR BTL (IV SOLUTION) ×1 IMPLANT
PACK SHOULDER (CUSTOM PROCEDURE TRAY) ×1 IMPLANT
PAD ARMBOARD POSITIONER FOAM (MISCELLANEOUS) ×1 IMPLANT
PAD COLD SHLDR WRAP-ON (PAD) ×1 IMPLANT
PIN NITINOL TARGETER 2.8 (PIN) IMPLANT
PIN SET MODULAR GLENOID SYSTEM (PIN) IMPLANT
RESTRAINT HEAD UNIVERSAL NS (MISCELLANEOUS) ×1 IMPLANT
SCREW CENTRAL MODULAR 25 (Screw) IMPLANT
SCREW PERI LOCK 5.5X24 (Screw) IMPLANT
SCREW PERIPHERAL 5.5X20 LOCK (Screw) IMPLANT
SLING ARM FOAM STRAP LRG (SOFTGOODS) IMPLANT
SLING ARM FOAM STRAP MED (SOFTGOODS) IMPLANT
STEM HUMERAL UNIVER REV SIZE 7 (Stem) IMPLANT
STRIP CLOSURE SKIN 1/2X4 (GAUZE/BANDAGES/DRESSINGS) ×1 IMPLANT
SUT MNCRL AB 3-0 PS2 18 (SUTURE) ×1 IMPLANT
SUT MON AB 2-0 CT1 36 (SUTURE) ×1 IMPLANT
SUT VIC AB 1 CT1 36 (SUTURE) ×1 IMPLANT
SUTURE TAPE 1.3 40 TPR END (SUTURE) ×2 IMPLANT
TOWEL GREEN STERILE FF (TOWEL DISPOSABLE) ×1 IMPLANT
TOWEL OR DSP ST BLU DLX 10/PK (DISPOSABLE) ×1 IMPLANT
TUBE SUCTION HIGH CAP CLEAR NV (SUCTIONS) ×1 IMPLANT
TUBING CONNECTING 10 (TUBING) ×1 IMPLANT
WATER STERILE IRR 1000ML POUR (IV SOLUTION) ×2 IMPLANT

## 2024-05-02 NOTE — Anesthesia Procedure Notes (Signed)
 Anesthesia Regional Block: Interscalene brachial plexus block   Pre-Anesthetic Checklist: , timeout performed,  Correct Patient, Correct Site, Correct Laterality,  Correct Procedure, Correct Position, site marked,  Risks and benefits discussed,  Surgical consent,  Pre-op evaluation,  At surgeon's request and post-op pain management  Laterality: Right  Prep: chloraprep       Needles:  Injection technique: Single-shot  Needle Type: Echogenic Stimulator Needle     Needle Length: 9cm  Needle Gauge: 21     Additional Needles:   Procedures:,,,, ultrasound used (permanent image in chart),,    Narrative:  Start time: 05/02/2024 8:13 AM End time: 05/02/2024 8:18 AM Injection made incrementally with aspirations every 5 mL.  Performed by: Personally  Anesthesiologist: Erma Thom SAUNDERS, MD  Additional Notes: Discussed risks and benefits of the nerve block in detail, including but not limited vascular injury, permanent nerve damage and infection.   Patient tolerated the procedure well. Local anesthetic introduced in an incremental fashion under minimal resistance after negative aspirations. No paresthesias were elicited. After completion of the procedure, no acute issues were identified and patient continued to be monitored by RN.

## 2024-05-02 NOTE — Anesthesia Procedure Notes (Addendum)
 Procedure Name: Intubation Date/Time: 05/02/2024 9:30 AM  Performed by: Erma Thom SAUNDERS, MDPre-anesthesia Checklist: Patient identified, Emergency Drugs available, Suction available, Patient being monitored and Timeout performed Patient Re-evaluated:Patient Re-evaluated prior to induction Oxygen Delivery Method: Circle system utilized Preoxygenation: Pre-oxygenation with 100% oxygen Induction Type: IV induction Ventilation: Mask ventilation without difficulty Laryngoscope Size: 4 and Glidescope Grade View: Grade I Tube type: Oral Tube size: 7.0 mm Number of attempts: 1 Airway Equipment and Method: Stylet Placement Confirmation: ETT inserted through vocal cords under direct vision, positive ETCO2, CO2 detector and breath sounds checked- equal and bilateral Secured at: 23 cm Tube secured with: Tape Dental Injury: Teeth and Oropharynx as per pre-operative assessment  Comments: Loose teeth remain intact post intubation.

## 2024-05-02 NOTE — Transfer of Care (Signed)
 Immediate Anesthesia Transfer of Care Note  Patient: Brenda Dorsey  Procedure(s) Performed: Procedures: ARTHROPLASTY, SHOULDER, TOTAL, REVERSE (Right)  Patient Location: PACU  Anesthesia Type:General  Level of Consciousness:  sedated, patient cooperative and responds to stimulation  Airway & Oxygen Therapy:Patient Spontanous Breathing and Patient connected to face mask oxgen  Post-op Assessment:  Report given to PACU RN and Post -op Vital signs reviewed and stable  Post vital signs:  Reviewed and stable  Last Vitals:  Vitals:   05/02/24 0612 05/02/24 1126  BP: (!) 118/57 (!) 131/57  Pulse: 89 93  Resp: 16 18  Temp: 36.8 C   SpO2: 96% 100%    Complications: No apparent anesthesia complications

## 2024-05-02 NOTE — H&P (Signed)
 Brenda Dorsey    Chief Complaint: Right 4 part proximal humerus fracture HPI: The patient is a 78 y.o. female status post ground-level fall sustaining a severely displaced right four-part proximal humerus fracture.  She is brought to the operating today for planned reverse shoulder arthroplasty.  Past Medical History:  Diagnosis Date   Ankle fracture    Basal cell carcinoma of skin    CKD (chronic kidney disease), stage III (HCC)    Closed fracture of right proximal humerus 04/28/2024   Complication of anesthesia    overdosed during colon surgery required narcan   Heart murmur    Hyperlipemia    Hypertension    Open wound    Left ankle   PONV (postoperative nausea and vomiting)    Squamous cell skin cancer       Past Surgical History:  Procedure Laterality Date   ABDOMINAL HYSTERECTOMY     CATARACT EXTRACTION, BILATERAL     COLON SURGERY     emergent after colonoscopy   COLONOSCOPY     YAG LASER APPLICATION Right 06/28/2015   Procedure: YAG LASER APPLICATION;  Surgeon: Dow JULIANNA Burke, MD;  Location: [facility];  Service: Ophthalmology;  Laterality: Right;    Family History  Problem Relation Age of Onset   Breast cancer Sister     Social History:  reports that she has never smoked. She does not have any smokeless tobacco history on file. She reports that she does not currently use alcohol . She reports that she does not use drugs.  BMI: Estimated body mass index is 23.96 kg/m as calculated from the following:   Height as of this encounter: 5' 2 (1.575 m).   Weight as of this encounter: 59.4 kg.  No results found for: ALBUMIN Diabetes: Patient does not have a diagnosis of diabetes.     Smoking Status:   reports that she has never smoked. She does not have any smokeless tobacco history on file.     Medications Prior to Admission  Medication Sig Dispense Refill   aspirin EC 81 MG tablet Take 81 mg by mouth at bedtime.     cyclobenzaprine (FLEXERIL) 10 MG  tablet Take 10 mg by mouth 3 (three) times daily as needed for muscle spasms.     losartan (COZAAR) 25 MG tablet Take 25 mg by mouth at bedtime.     ondansetron  (ZOFRAN ) 4 MG tablet Take 4 mg by mouth every 8 (eight) hours as needed for nausea or vomiting.     oxyCODONE -acetaminophen  (PERCOCET/ROXICET) 5-325 MG tablet Take 1 tablet by mouth every 6 (six) hours as needed for severe pain (pain score 7-10).     traMADol (ULTRAM) 50 MG tablet Take 50 mg by mouth every 6 (six) hours as needed.     atorvastatin (LIPITOR) 20 MG tablet Take 20 mg by mouth at bedtime.     citalopram (CELEXA) 20 MG tablet Take 20 mg by mouth at bedtime.     lisinopril (PRINIVIL,ZESTRIL) 20 MG tablet Take 20 mg by mouth at bedtime.       Physical Exam: Patient is a well-developed female who is alert and oriented.  She does have facial ecchymosis related to the recent fall with loosening of her upper front teeth.  Right upper extremity demonstrates diffuse swelling and tenderness related to the recent fall.  Compartments are soft.  She is neurovascular intact in the right extremity.  Severe pain with any attempts of motion of the shoulder.  Imaging studies confirm  a severely displaced right four-part proximal humerus fracture.  Vitals  Temp:  [98.3 F (36.8 C)] 98.3 F (36.8 C) (12/26 0612) Pulse Rate:  [89] 89 (12/26 0612) Resp:  [16] 16 (12/26 0612) BP: (118)/(57) 118/57 (12/26 0612) SpO2:  [96 %] 96 % (12/26 0612) Weight:  [59.4 kg] 59.4 kg (12/26 0612)  Assessment/Plan  Impression: Right 4 part proximal humerus fracture  Plan of Action: Procedures: ARTHROPLASTY, SHOULDER, TOTAL, REVERSE  Brenda Dorsey 05/02/2024, 6:39 AM Contact # [phone]

## 2024-05-02 NOTE — Discharge Instructions (Signed)
 "   Franky HERO. Supple, M.D., F.A.A.O.S. Orthopaedic Surgery Specializing in Arthroscopic and Reconstructive Surgery of the Shoulder [phone] [address] [place], [place] [address] - Fax [phone]    POST-OP REVERSE TOTAL SHOULDER REPLACEMENT INSTRUCTIONS  1. Your first follow up appointment is:  2. The bandage over your incision is waterproof. You may begin showering with this dressing on immediately but do not submerge in a bath tub or hot tub. You may leave this dressing on until first follow up appointment in around 2 weeks. We prefer you leave this dressing in place until follow up however after 5-7 days if you are having itching or skin irritation and would like to remove it you may do so. Go slow and tug at the borders gently to break the bond the dressing has with the skin. At this point if there is no drainage it is okay to go without a bandage or you may cover it with a light gauze and tape. Leave your steri-strips across your incision. You can also expect significant bruising around your shoulder that will drift down your arm and into your chest wall. This is very normal and should resolve over several days. It is also very normal to have some swelling to the operative extremity that will involve the forearm and hand and fingers. It is recommended that if you notice this then try to elevate your hand and fist above your heart and perform gripping exercises several times an hour to help reduce this swelling (see #4).   3. Wear your sling if you are going to be up walking around and when you go to sleep at night. Also protect the arm in the sling until your nerve block has worn off. Then it is ok to remove the sling to perform the exercises below or to occasionally let your arm dangle by your side to stretch your elbow. It is ok to remove your sling if you are sitting in a controlled environment and allow your arm to rest in a position of comfort by your side or on your  lap with pillows to give your neck and skin a break from the sling. You may remove it to allow arm to dangle by side to shower. It is also ok to use your operative extremity to help do light waist level activities and to assist with hygiene and ADLs such as brushing your teeth, getting dressed and toileting. We do ask you refrain from reaching behind your body or back. Also do not use the operative arm to support body weight when getting up or down from bed or a chair.  4. Range of motion to your elbow, wrist, and hand are encouraged 3-5 times daily. Exercise to your hand and fingers helps to reduce swelling you may experience. If you have a foam ball you can use this. If not you can simply use a washcloth to squeeze.   5. Utilize the ice machine as much as possible over the first several days, but it is OK to remove to get up to walk around several times a day to stretch your legs and to go to the bathroom. When in the ice machine, please make sure there is a layer of clothing between the sleeve and your skin and check your skin frequently to make sure there is no redness or skin irritation, if you notice this then give your skin a break from the ice machine for several hours. After the first 3-4 days  you can gradually reduce how much time you spend in the ice machine and adjust to your level of pain. In general it is a good rule of thumb that if you are experiencing pain you should be using the ice machine.  6. Prescriptions for a pain medication and a muscle relaxant are provided for you. It is recommended that if you are experiencing pain that your pain medication alone is not controlling, add the muscle relaxant along with the pain medication which can give additional pain relief. The first 1-2 days after your block has worn off is generally the most severe of your pain and then should gradually decrease. As your pain lessens it is recommended that you decrease your use of the pain medications to an as  needed basis' only and to always comply with the recommended dosages of the pain medications. It is also ok to utilize over the counter form of pain medicines such as acetaminophen  instead of the prescription pain medications if your pain is only mild. But do not use the prescription pain med and acetaminophen  at the same time, it is either one or the other, not both.  7. Pain medications can produce constipation along with their use. If you experience this, the use of an over the counter stool softener or laxative daily is recommended.   8. For additional questions or concerns, please do not hesitate to call the office. If after hours there is an answering service to forward your concerns to the physician or physician assistant on call.  9. Pain control following an exparel  nerve block:  To help control your post-operative pain you received a nerve block  performed with Exparel  which is a long acting anesthetic (numbing agent) which can provide pain relief and sensations of numbness (and relief of pain) in the operative shoulder and arm for up to 3 days. Sometimes it provides mixed relief, meaning you may still have numbness in certain areas of the arm but can still be able to move parts of that arm, hand, and fingers. We recommend that your prescribed pain medications be used as needed. We do not feel it is necessary to pre medicate and stay ahead of pain.  Taking narcotic pain medications when you are not having any pain can lead to unnecessary and potentially dangerous side effects.  While the effects of the nerve block are present, please be aware that while you do not have sensation we recommend you pay careful attention to keep your arm positioned in a protective way. Also if you have a sling that has a thumb loop that helps to keep the sling from sliding backwards, make sure you remove the loop to avoid a constant pressure to the thumb which can cause some nerve damage or skin  breakdown.  10. Most people find it more comfortable to sleep in a semi upright position or in a recliner with the arm supported. Again, we do recommend you wear your sling to sleep. It is also ok to try to sleep lying flat in bed with a pillow behind your arm to keep it from sliding or falling backwards. If you are trying to sleep on the non-operative side, please use pillows to position your arm so that it does not slide forwards or backwards. It is very common that sleeping and getting into a comfortable position is difficult after shoulder surgery, this should improve with time.  11. Dressing - It is recommended you wear shirts that are loose or button up  the front. To put shirt on allow operative arm to dangle by your side and slide the shirt on that arm, then your head and then the non-operative arm. To remove the shirt do this sequence in reverse. We recommend you wear the sling over top of your shirt to prevent skin irritation. If you notice irritation in your axilla (arm pit) please try to elevate arm away from your body to allow air to get in this area and consider use of an over the counter ointment such as hydrocortisone or simple lotion.  FOR ADDITIONAL INFO ON THE [employer]Y ICE MACHINE AND INSTRUCTIONS GO TO THE WEBSITE AT  [email or web address]   I  12.  We recommend that you avoid any dental work or cleaning in the first 3 months following your joint replacement. This is to help minimize the possibility of infection from the bacteria in your mouth that enters your bloodstream during dental work. We also recommend that you take an antibiotic prior to your dental work for the first year after your shoulder replacement to further help reduce that risk. Please simply contact our office for antibiotics to be sent to your pharmacy prior to dental work.  13. Post Op Exercises:   No exercvises but can allow arm to dangle for hygiene and to occasionally  stretch elbow   "

## 2024-05-02 NOTE — Evaluation (Signed)
 Occupational Therapy Evaluation Patient Details Name: Brenda Dorsey MRN: [id number] DOB: December 09, 1945 Today's Date: 05/02/2024   History of Present Illness   Brenda Dorsey is an 78 yo female status post ground-level fall sustaining a severely displaced right four-part proximal humerus fracture. 12/26 planned R reverse shoulder arthroplasty. PMH: ankle fx, CA skin, HTN, hyperlipidemia     Clinical Impressions PTA pt lives at home with granddaughter Brenda Dorsey who was present for all therapy and education this visit .  Education completed regarding compensatory strategies for ADL tasks and functional mobility, management of sling, R ROM per specified parameters in the order set as indicated below, positioning of operative arm in sitting and supine and edema control, including use of Iceman Cold Therapy machine. Caregiver present for education, written handouts provided and reviewed using Teach Back and pt/caregiver verbalized/demonstrated understanding. Due to the below listed deficits, pt requires min A assistance with ADL tasks and Supervision assist with functional mobility on level surfaces. Caregiver will be able to provide necessary level of assistance at discharge. Pt to follow up with MD to progress rehab of the operative shoulder.      If plan is discharge home, recommend the following:   A little help with walking and/or transfers;A little help with bathing/dressing/bathroom;Assistance with cooking/housework;Assist for transportation;Help with stairs or ramp for entrance     Functional Status Assessment   Patient has had a recent decline in their functional status and demonstrates the ability to make significant improvements in function in a reasonable and predictable amount of time.     Equipment Recommendations   None recommended by OT      Precautions/Restrictions   Precautions Precautions: Shoulder Shoulder FF 0-60; ER 0-20; Abd 0-45. ROM for ADL purposes only; AROM  elbow/wrist/hand; Loosen sling from neck when arm is supported in sitting Precaution Booklet Issued: Yes (comment) Required Braces or Orthoses: Sling Restrictions Weight Bearing Restrictions Per Provider Order: Yes RUE Weight Bearing Per Provider Order: Non weight bearing     Mobility Bed Mobility Overal bed mobility: Needs Assistance Bed Mobility: Supine to Sit     Supine to sit: Supervision, HOB elevated     General bed mobility comments: educated on post op R shoulder bed positioning and sling use at night    Transfers Overall transfer level: Needs assistance Equipment used: None Transfers: Sit to/from Stand, Bed to chair/wheelchair/BSC Sit to Stand: Supervision     Step pivot transfers: Supervision     General transfer comment: grand dtr Brenda Dorsey present for training for toilet and transport chair as well as assist with amb      Balance Overall balance assessment: No apparent balance deficits (not formally assessed)                                         ADL either performed or assessed with clinical judgement   ADL Overall ADL's : Needs assistance/impaired    Per orders, R shoulder parameters as follows for ADL tasks: Abd 0-45; ER 0-20; FF 0-60. While moving within specified parameters, pt/caregiver instructed on bathing and how to donn/doff shirt, placing operative arm through sleeve first when donning and off last when doffing.Pt/caregiver educated on compensatory strategies for LB ADL and strategies to reduce risk of falls.  Pt/caregiver educated on donning/doffing sling and to wear the sling at all times with the exception of ADL, and to loosen the neck strap of  the sling when the operative arm is in a supported position when sitting. In sitting or supine, pt instructed to have a pillow behind and under their operative arm to provide support. If assist needed with ambulation, caregiver educated on the importance of walking on pt's non-operative side.   Education regarding use of IceMan Cold Therapy completed, including the importance of using a barrier on the shoulder prior to positioning the wrap-on pad. Pt/caregiver verbalized/demonstrated understanding. Teach Back used while caregiver assisted with dressing pt and positioning wrap-on pad to facilitate DC.                                          Vision Baseline Vision/History: 0 No visual deficits;1 Wears glasses Patient Visual Report: No change from baseline Vision Assessment?: Wears glasses for reading;No apparent visual deficits            Pertinent Vitals/Pain Pain Assessment Pain Assessment: No/denies pain     Extremity/Trunk Assessment Upper Extremity Assessment Upper Extremity Assessment: Right hand dominant;RUE deficits/detail RUE Deficits / Details: post op nerve block active with AAROM elbow, wrist, hand WFL's RUE Coordination: decreased fine motor;decreased gross motor   Lower Extremity Assessment Lower Extremity Assessment: Overall WFL for tasks assessed   Cervical / Trunk Assessment Cervical / Trunk Assessment: Normal   Communication Communication Communication: No apparent difficulties   Cognition Arousal: Alert Behavior During Therapy: WFL for tasks assessed/performed Cognition: No apparent impairments                               Following commands: Intact       Cueing  General Comments   Cueing Techniques: Verbal cues  bruising post fall present, new post up R shoulder dressing intact   Exercises Exercises: Shoulder Shoulder Exercises Elbow Flexion: AAROM, Right, 10 reps Elbow Extension: AAROM, Right, 10 reps Wrist Flexion: AAROM, Right, 10 reps Wrist Extension: AAROM, Right, 10 reps Digit Composite Flexion: AAROM, Right, 10 reps Composite Extension: AAROM, Right, 10 reps   Shoulder Instructions Shoulder Instructions Donning/doffing shirt without moving shoulder: Minimal assistance;Caregiver  independent with task Method for sponge bathing under operated UE: Minimal assistance;Caregiver independent with task Donning/doffing sling/immobilizer: Minimal assistance;Caregiver independent with task Correct positioning of sling/immobilizer: Minimal assistance;Caregiver independent with task ROM for elbow, wrist and digits of operated UE: Minimal assistance;Caregiver independent with task Sling wearing schedule (on at all times/off for ADL's): Minimal assistance;Caregiver independent with task Proper positioning of operated UE when showering: Minimal assistance;Caregiver independent with task Positioning of UE while sleeping: Minimal assistance;Caregiver independent with task    Home Living Family/patient expects to be discharged to:: Private residence Living Arrangements: Other relatives Available Help at Discharge: Family Type of Home: House Home Access: Stairs to enter Secretary/administrator of Steps: 1 Entrance Stairs-Rails: None Home Layout: One level;Laundry or work area in basement     [employer] Shower/Tub: It Trainer: Standard     Home Equipment: Information systems manager          Prior Functioning/Environment Prior Level of Function : Independent/Modified Independent;Driving                    OT Problem List: Impaired UE functional use    AM-PAC OT 6 Clicks Daily Activity     Outcome Measure Help from another person eating meals?: A Little  Help from another person taking care of personal grooming?: A Little Help from another person toileting, which includes using toliet, bedpan, or urinal?: A Little Help from another person bathing (including washing, rinsing, drying)?: A Little Help from another person to put on and taking off regular upper body clothing?: A Little Help from another person to put on and taking off regular lower body clothing?: A Little 6 Click Score: 18   End of Session Equipment Utilized During Treatment: Other  (comment) (sling) Nurse Communication: Mobility status;Other (comment) (use of bathroom with + void and BM)  Activity Tolerance: Patient tolerated treatment well Patient left: in chair;with call bell/phone within reach;with family/visitor present;with nursing/sitter in room  OT Visit Diagnosis: Unsteadiness on feet (R26.81)                Time: 1315-1400 OT Time Calculation (min): 45 min Charges:  OT General Charges $OT Visit: 1 Visit OT Evaluation $OT Eval Low Complexity: 1 Low OT Treatments $Self Care/Home Management : 8-22 mins $Therapeutic Exercise: 8-22 mins Reann Dobias OT/L Acute Rehabilitation Department  [phone]  05/02/2024, 3:22 PM

## 2024-05-02 NOTE — Anesthesia Preprocedure Evaluation (Signed)
"                                    Anesthesia Evaluation  Patient identified by MRN, date of birth, ID band Patient awake    Reviewed: Allergy & Precautions, H&P , NPO status , Patient's Chart, lab work & pertinent test results  History of Anesthesia Complications (+) PONV and history of anesthetic complications  Airway Mallampati: II  TM Distance: >3 FB Neck ROM: Full    Dental  (+) Loose Top front 4 teeth lose from fall :   Pulmonary neg pulmonary ROS, neg COPD   Pulmonary exam normal breath sounds clear to auscultation       Cardiovascular hypertension, (-) angina Normal cardiovascular exam Rhythm:Regular Rate:Normal     Neuro/Psych neg Seizures negative neurological ROS  negative psych ROS   GI/Hepatic negative GI ROS, Neg liver ROS,,,  Endo/Other  negative endocrine ROS    Renal/GU CRFRenal disease  negative genitourinary   Musculoskeletal  Closed fracture of right proximal humerus   Abdominal   Peds negative pediatric ROS (+)  Hematology negative hematology ROS (+)   Anesthesia Other Findings   Reproductive/Obstetrics negative OB ROS                              Anesthesia Physical Anesthesia Plan  ASA: 2  Anesthesia Plan: General and Regional   Post-op Pain Management: Tylenol  PO (pre-op)* and Regional block*   Induction: Intravenous  PONV Risk Score and Plan: 4 or greater and Ondansetron , Dexamethasone  and Treatment may vary due to age or medical condition  Airway Management Planned: Oral ETT  Additional Equipment: None  Intra-op Plan:   Post-operative Plan: Extubation in OR  Informed Consent: I have reviewed the patients History and Physical, chart, labs and discussed the procedure including the risks, benefits and alternatives for the proposed anesthesia with the patient or authorized representative who has indicated his/her understanding and acceptance.     Dental advisory given  Plan  Discussed with: CRNA  Anesthesia Plan Comments:          Anesthesia Quick Evaluation  "

## 2024-05-02 NOTE — Op Note (Signed)
 05/02/2024  10:58 AM  PATIENT:   Brenda Dorsey  78 y.o. female  PRE-OPERATIVE DIAGNOSIS:  Right 4 part proximal humerus fracture  POST-OPERATIVE DIAGNOSIS: Same  PROCEDURE: Right shoulder reverse arthroplasty lysing a press-fit size 7 Arthrex standard length humeral stem with a neutral metathesis, +3 polyethylene insert, 33/+4 glenosphere on a small/+2 baseplate, 25 mm lag screw  SURGEON:  Tynan Boesel, Franky BATTLE M.D.  ASSISTANTS: Randine Ricks, PA-C  Randine Ricks, PA-C was utilized as an geophysicist/field seismologist throughout this case, essential for help with positioning the patient, positioning extremity, tissue manipulation, implantation of the prosthesis, suture management, wound closure, and intraoperative decision-making.  ANESTHESIA:   General Endotracheal and interscalene block with Exparel   EBL: 100 cc  SPECIMEN: None  Drains: None   PATIENT DISPOSITION:  PACU - hemodynamically stable.    PLAN OF CARE: Discharge to home after PACU  Brief history:  Patient is an 78 year old female who fell while standing on her bed trying to change a light bulb landing on the right shoulder as well as her face, sustaining a severely comminuted and displaced right four-part proximal humerus fracture.  She also had facial trauma with some loosening of her front 4 teeth and marked bruising.  CT scan of the head did not show any obvious acute traumatic facial or intracranial injuries, multiple tongue lacerations as well which have been previously treated with suturing.  She is now brought to the operating room for planned right shoulder reverse arthroplasty.  Preoperatively, I counseled the patient regarding treatment options and risks versus benefits thereof.  Possible surgical complications were all reviewed including potential for bleeding, infection, neurovascular injury, persistent pain, loss of motion, anesthetic complication, failure of the implant, and possible need for additional surgery. They understand  and accept and agrees with our planned procedure.   Procedure detail:  After undergoing routine preop evaluation the patient received prophylactic antibiotics and interscalene block with Exparel  was established in the holding area by the anesthesia department.  Subsequently placed spine on the operating table and underwent the smooth induction of a general endotracheal anesthesia.  Placed into the beachchair position and appropriately padded and protected.  The right shoulder girdle region was sterilely prepped and draped in standard fashion.  Timeout was called.  Deltopectoral approach to the right shoulder is made an approximately 6 cm incision.  Skin flaps elevated dissection carried deeply and the deltopectoral interval was then developed from proximal to distal with the vein taken laterally.  Adhesions were divided beneath the deltoid and the conjoined tendon was mobilized and retracted medially.  The long head biceps tendon was then tenodesed at the upper border of the pectoralis major tendon with the proximal segment unroofed and excised.  An osteotome was then used to create an osteotomy along the bicipital groove freeing up the lesser tuberosity and the subscapularis was then tagged with a pair of grasping sutures at the bone tendon junction of the remnant of the lesser tuberosity and the lesser tuberosity bone segment was rongeured to an appropriate size for later repair.  The rotator interval was then split to the base of the coracoid using electrocautery.  A pair of grasping sutures were then placed through the bone tendon junction of the greater tuberosity for manipulation and later repair.  This allowed exposure to the articular segment which had completely separated from the surrounding bony attachments and it was removed as a single piece.  The joint was then copiously irrigated remove all residual bony debris.  We then  carefully exposed the glenoid and performed a circumferential labral  resection.  Guidepin was then directed into the center of the glenoid and the glenoid was then prepared with the central followed by the peripheral reamer to a stable subchondral bony bed in preparation completed with a drill and tapped for a 25 mm lag screw.  We did size for a 33 glenosphere.  A baseplate was then assembled and inserted with vancomycin  powder applied to the threads of the lag screw and excellent fixation was achieved.  The peripheral locking screws were all then placed using standard technique with excellent fixation.  A 33/+4 glenosphere was then impacted onto the baseplate and the central locking screw was placed.  We then returned our attention back to the humeral shaft where we placed a cerclage tape around the humeral metaphysis and provisionally tensioned this to help protect the the metaphyseal bone during broaching.  We then broached up to a size 7 and approximately 20 degrees of retroversion achieving excellent fixation.  Trial reduction showed good motion stability and soft tissue balance.  This point the trial implants were removed.  Our final implant was assembled.  We seated the size 7 stem to the appropriate level and then completed tensioning with our fiber cerclage.  We then confirmed that a +3 would allow appropriate reduction and ultimately a +3 poly was impacted on the implant and a final reduction was then performed showing good stability and soft tissue balance.  At this point we confirmed appropriate mobility of the greater and lesser tuberosities in the rotator cuff envelope around the implant and I did pass suture tape sutures through the subscapularis tendon and through the eyelet on the collar of the implant anteriorly and back up through the tendon to allow transfixion of the lesser tuberosity of the implant and then in a similar fashion placed another suture tape down through the bone tendon junction of the greater tuberosity, through the superior eyelid on the implant  and then back up through the tendon again to allow transfixion of the greater tuberosity to our implant.  Then utilizing the previously placed fiber cerclage suture limbs we transfixed the inferior suture limb from the greater tuberosity to the humeral shaft in a similar fashion the lesser tuberosity down to the humeral shaft using the fiber cerclage limbs and then performed a side-to-side repair between the tuberosities with the more superior of the previously placed grasping sutures allowing excellent reapposition of the greater and lesser tuberosities about the implant and down to the appropriate level on the humeral shaft.  I then upon completion sutured the limbs that are then passed through the bone tendon junction to the eyelets on the collar of the implant allowing completion of the repair of the tuberosities back to the implant allowing stability there was much to our satisfaction.  At this time the wound was copiously irrigated.  Final hemostasis was obtained.  The balance of her vancomycin  powder was then sprayed liberally throughout the deep soft tissue planes.  The deltopectoral interval was reapproximated with a series of figure-of-eight #1 Vicryl sutures.  2-0 Monocryl used to close the subcu layer and intracuticular 3-0 Monocryl used to close the skin followed by Steri-Strips and Aquacel dressing.  The right arm was then placed into a sling.  The patient was awakened, extubated, and taken to the recovery room in stable condition.  Franky CHRISTELLA Pointer MD   Contact # [phone]

## 2024-05-04 NOTE — Anesthesia Postprocedure Evaluation (Signed)
"   Anesthesia Post Note  Patient: Brenda Dorsey  Procedure(s) Performed: ARTHROPLASTY, SHOULDER, TOTAL, REVERSE (Right: Shoulder)     Patient location during evaluation: PACU Anesthesia Type: Regional and General Level of consciousness: awake and alert Pain management: pain level controlled Vital Signs Assessment: post-procedure vital signs reviewed and stable Respiratory status: spontaneous breathing, nonlabored ventilation, respiratory function stable and patient connected to nasal cannula oxygen Cardiovascular status: blood pressure returned to baseline and stable Postop Assessment: no apparent nausea or vomiting Anesthetic complications: no   No notable events documented.  Last Vitals:  Vitals:   05/02/24 1315 05/02/24 1345  BP: (!) 119/48 (!) 121/53  Pulse: 89 90  Resp:    Temp:    SpO2: 91%     Last Pain:  Vitals:   05/02/24 1230  TempSrc:   PainSc: 0-No pain                 Thom JONELLE Peoples      "

## 2024-05-05 ENCOUNTER — Encounter (HOSPITAL_COMMUNITY): Payer: Self-pay | Admitting: Orthopedic Surgery
